# Patient Record
Sex: Female | Born: 1985 | Race: White | Hispanic: Yes | Marital: Single | Smoking: Never smoker
Health system: Southern US, Community
[De-identification: ages and names within clinical notes are randomized; demographics above are authoritative.]

## PROBLEM LIST (undated history)

## (undated) DIAGNOSIS — Z8759 Personal history of other complications of pregnancy, childbirth and the puerperium: Secondary | ICD-10-CM

## (undated) HISTORY — DX: Personal history of other complications of pregnancy, childbirth and the puerperium: Z87.59

## (undated) HISTORY — PX: NO PAST SURGERIES: SHX2092

---

## 2004-07-19 ENCOUNTER — Ambulatory Visit: Admission: RE | Admit: 2004-07-19 | Discharge: 2004-07-19 | Payer: Self-pay | Admitting: *Deleted

## 2004-09-17 ENCOUNTER — Inpatient Hospital Stay (HOSPITAL_COMMUNITY): Admission: AD | Admit: 2004-09-17 | Discharge: 2004-09-23 | Payer: Self-pay | Admitting: *Deleted

## 2004-09-17 ENCOUNTER — Ambulatory Visit: Payer: Self-pay | Admitting: Obstetrics & Gynecology

## 2004-09-21 ENCOUNTER — Encounter (INDEPENDENT_AMBULATORY_CARE_PROVIDER_SITE_OTHER): Payer: Self-pay | Admitting: *Deleted

## 2004-09-26 ENCOUNTER — Inpatient Hospital Stay (HOSPITAL_COMMUNITY): Admission: AD | Admit: 2004-09-26 | Discharge: 2004-09-26 | Payer: Self-pay | Admitting: Obstetrics and Gynecology

## 2004-10-15 ENCOUNTER — Encounter: Admission: RE | Admit: 2004-10-15 | Discharge: 2004-11-14 | Payer: Self-pay | Admitting: *Deleted

## 2005-04-06 ENCOUNTER — Emergency Department (HOSPITAL_COMMUNITY): Admission: EM | Admit: 2005-04-06 | Discharge: 2005-04-06 | Payer: Self-pay | Admitting: Emergency Medicine

## 2005-04-16 ENCOUNTER — Ambulatory Visit: Payer: Self-pay | Admitting: Internal Medicine

## 2005-04-17 ENCOUNTER — Ambulatory Visit: Payer: Self-pay | Admitting: Internal Medicine

## 2005-05-23 ENCOUNTER — Ambulatory Visit: Payer: Self-pay | Admitting: Family Medicine

## 2005-07-09 ENCOUNTER — Ambulatory Visit: Payer: Self-pay | Admitting: Internal Medicine

## 2005-12-17 IMAGING — US US OB FOLLOW-UP
1 series · 13 of 28 positions shown · non-contrast
Comparison: none

CLINICAL DATA: Assess growth.  Question pregnancy induced hypertension.

[Series 1: us ob follow-up · 0.32mm/px · 13 of 41 slices shown]
[im 2/41]
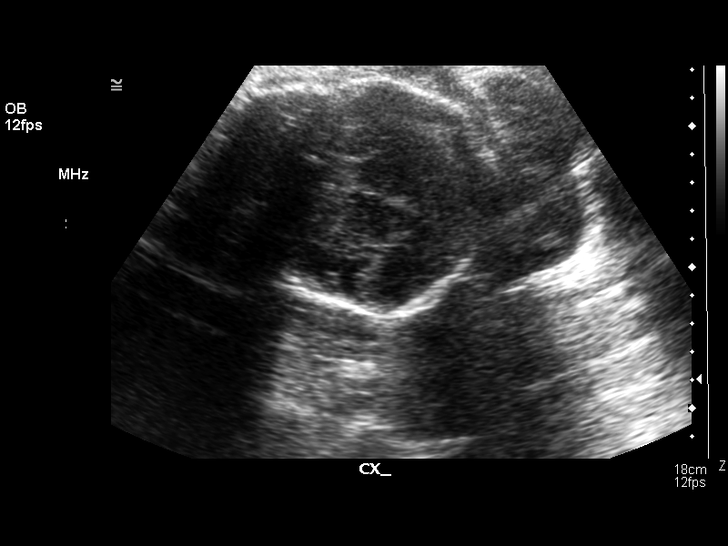
[im 5/41]
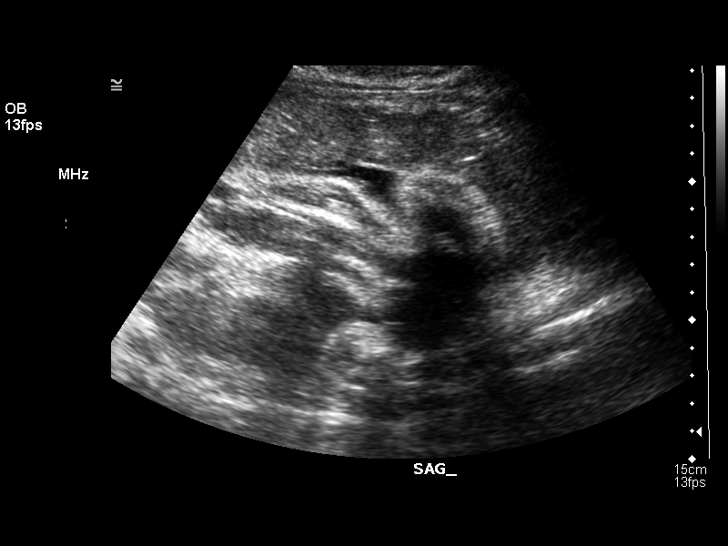
[im 8/41]
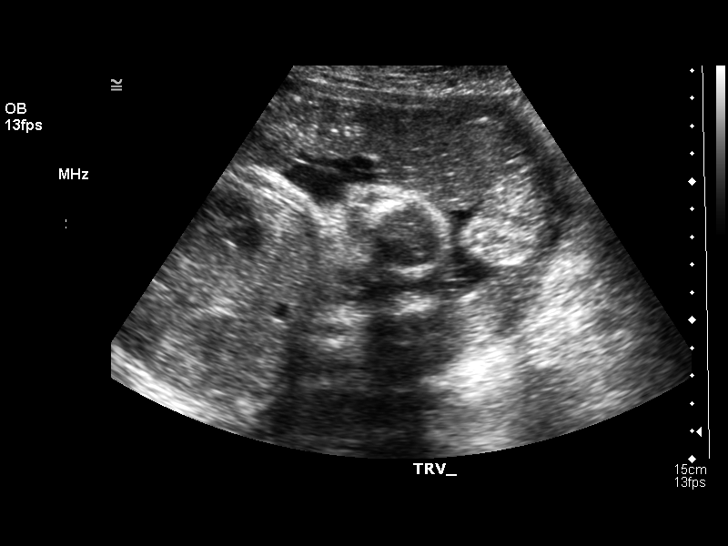
[im 11/41]
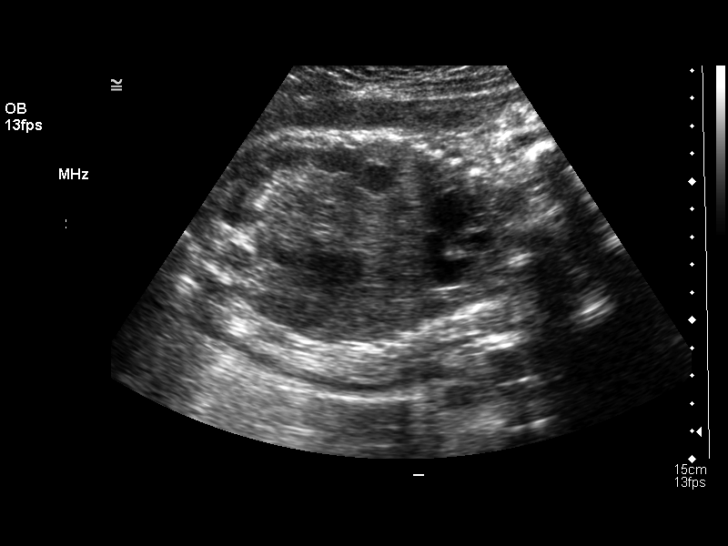
[im 14/41]
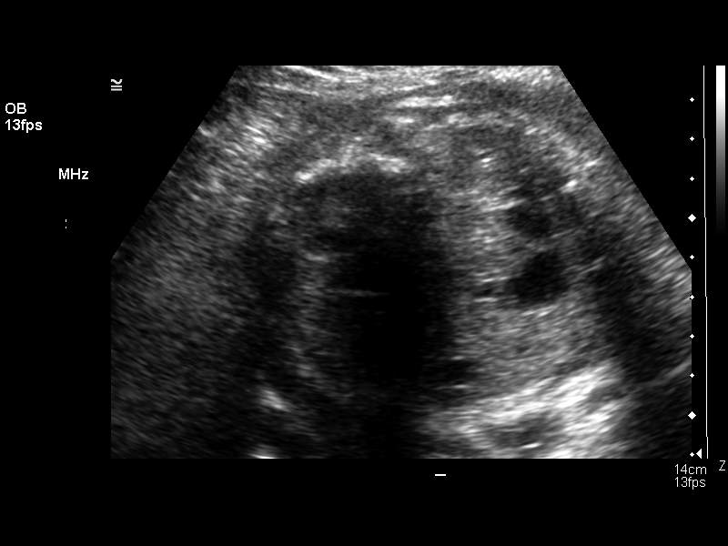
[im 17/41]
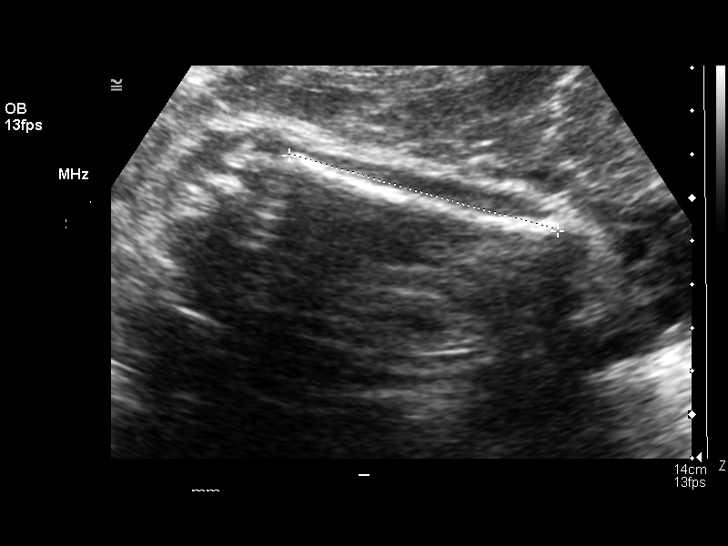
[im 21/41]
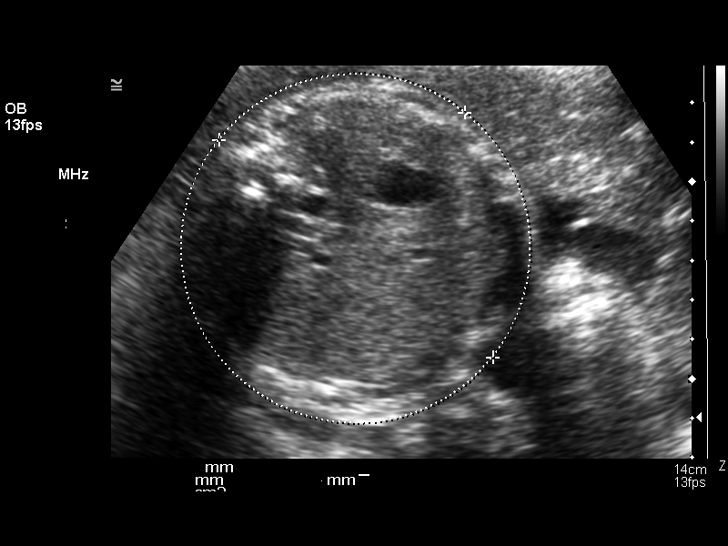
[im 24/41]
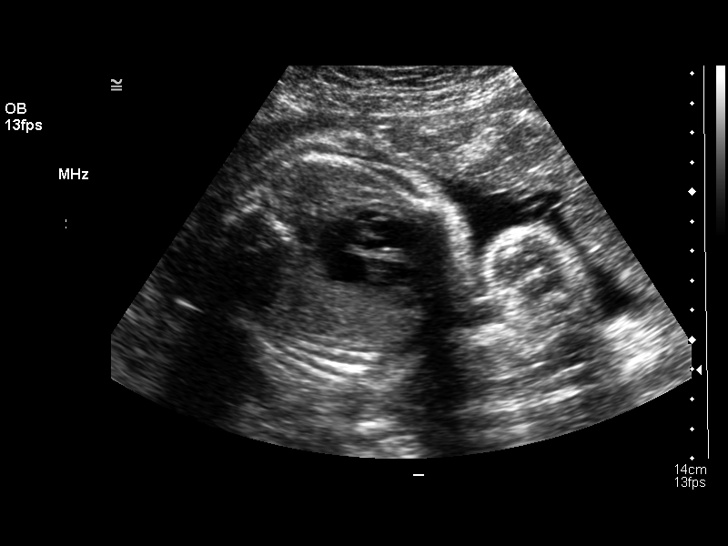
[im 27/41]
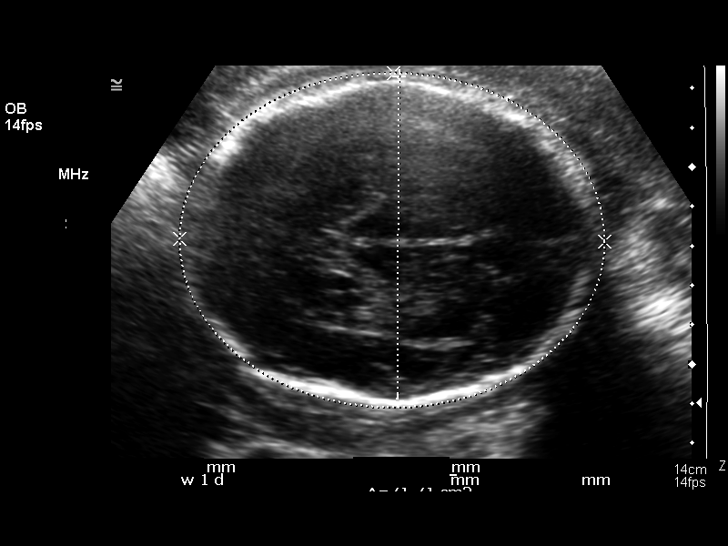
[im 30/41]
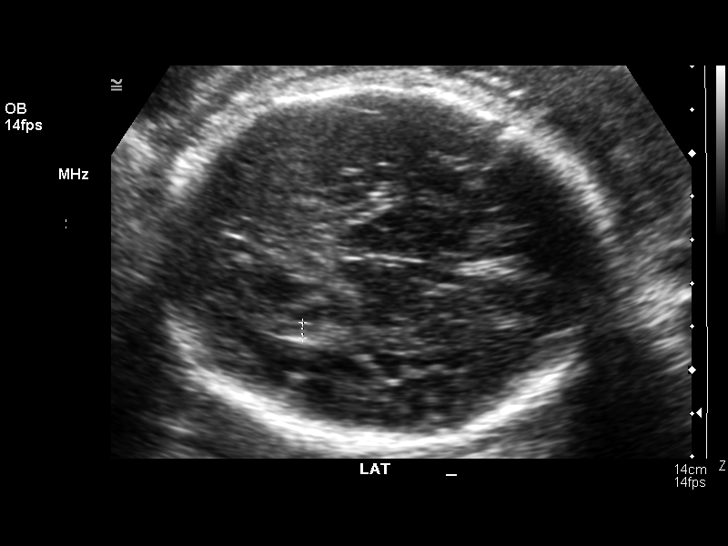
[im 33/41]
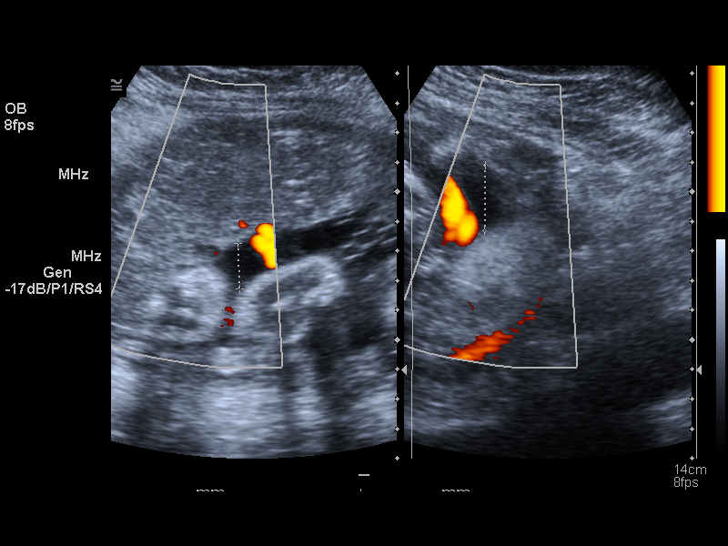
[im 36/41]
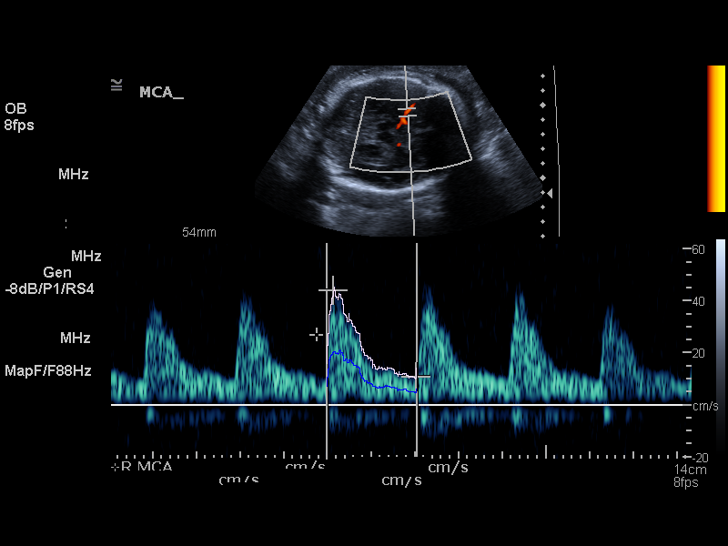
[im 39/41]
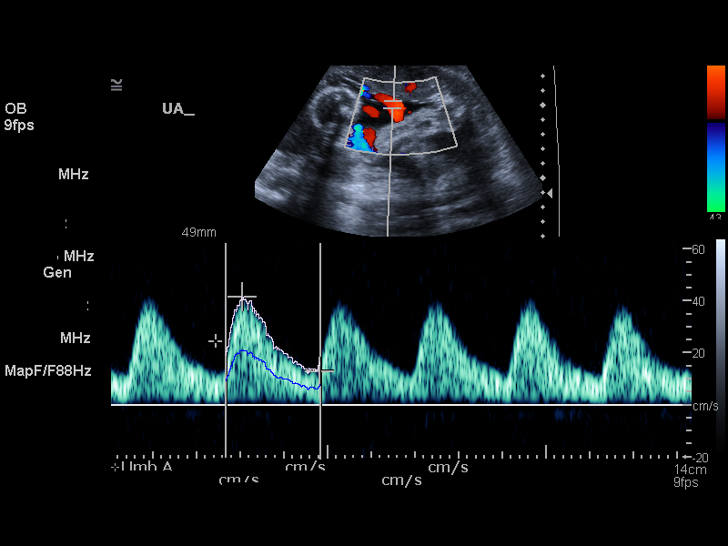

[13 of 28 positions shown; findings below may reference images not displayed]

OBSTETRICAL ULTRASOUND RE-EVALUATION:
Number of Fetuses:  1
Heart Rate:  147
Movement:  Yes
Breathing:  Yes
Presentation:  Cephalic
Placental Location:  Anterior
Grade:  II
Previa:  No
Amniotic Fluid (subjective):  Normal
Amniotic Fluid (objective):  10.1 cm AFI (5th -95th%ile = 8.1 ? 24.8 cm for 34 wks)

FETAL BIOMETRY
BPD:  8.2 cm   33 w 1 d
HC:  30.1 cm  33 w 3 d
AC:  28.3 cm   32 w 2 d
FL:  2.1 cm   33 w 2 d

Mean GA:  33 w 0 d
Assigned GA:  33 w 6 d
Fetal indices are within normal limits.  
EFW:  8938 g (H) 25th ? 50th%ile (5467 ? 1627 g) For 34 wks

FETAL ANATOMY
Lateral Ventricles:  Visualized 
Thalami/CSP:  Visualized 
Posterior Fossa:  Previously seen 
Nuchal Region:  N/A
Spine:  Previously seen 
4 Chamber Heart on Left:  Previously seen 
Stomach on Left:  Visualized 
3 Vessel Cord:  Previously seen 
Cord Insertion Site:  Previously seen 
Kidneys:  Visualized  
Bladder:  Visualized 
Extremities:  Previously seen 

Evaluation limited by:  Advanced gestational age 

MATERNAL FINDINGS
Cervix:  3.5 cm Transabdominally
BIOPHYSICAL PROFILE

Movement:  2    Time:  20 minutes
Breathing:  2
Tone:  2
Amniotic Fluid:  2

Total Score:  8

DOPPLER ULTRASOUND OF FETUS:

Umbilical Artery S/D Ratio: 3.01    (NL< 3.57)

Middle Cerebral Artery PI: 1.75    (NL> 1.35)
IMPRESSION: Single intrauterine pregnancy demonstrating an estimated gestational age by ultrasound of 33 weeks and 0 days.   This is 6 days behind assigned gestational age by initial ultrasound of 33 weeks and 6 days.  The abdominal circumference is smaller than the remaining gestational indicators with an absolute measurement of 28.3 cm (32 weeks 2 days).  Today the estimated fetal weight falls between the 25th and 50th percentile for a 34 week gestation and this represents a significant interval change since the previous exam performed at 25 weeks at which the estimated fetal weight was between the 75th and 90th percentile.  Findings suggest the possibility of developing asymmetric intrauterine growth restriction and close follow-up for growth is recommended.  
Subjectively and quantitatively normal amniotic fluid volume.  
Normal umbilical artery and middle cerebral artery Doppler assessment.
Biophysical profile score [DATE].  
No late developing fetal anatomic abnormalities are identified associated with the lateral ventricles, stomach, kidneys or bladder.  A four chamber heart view could not be reassessed due to positioning on today?s exam.

## 2006-10-25 ENCOUNTER — Emergency Department (HOSPITAL_COMMUNITY): Admission: EM | Admit: 2006-10-25 | Discharge: 2006-10-25 | Payer: Self-pay | Admitting: Emergency Medicine

## 2007-01-14 ENCOUNTER — Ambulatory Visit (HOSPITAL_COMMUNITY): Admission: RE | Admit: 2007-01-14 | Discharge: 2007-01-14 | Payer: Self-pay | Admitting: Family Medicine

## 2007-01-29 ENCOUNTER — Ambulatory Visit: Payer: Self-pay | Admitting: Obstetrics & Gynecology

## 2007-01-30 ENCOUNTER — Ambulatory Visit: Payer: Self-pay | Admitting: Gynecology

## 2007-02-19 ENCOUNTER — Ambulatory Visit: Payer: Self-pay | Admitting: Family Medicine

## 2007-03-05 ENCOUNTER — Ambulatory Visit: Payer: Self-pay | Admitting: Obstetrics & Gynecology

## 2007-03-19 ENCOUNTER — Ambulatory Visit: Payer: Self-pay | Admitting: Family Medicine

## 2007-04-02 ENCOUNTER — Ambulatory Visit: Payer: Self-pay | Admitting: Family Medicine

## 2007-04-03 ENCOUNTER — Ambulatory Visit (HOSPITAL_COMMUNITY): Admission: RE | Admit: 2007-04-03 | Discharge: 2007-04-03 | Payer: Self-pay | Admitting: Family Medicine

## 2007-04-16 ENCOUNTER — Ambulatory Visit: Payer: Self-pay | Admitting: Obstetrics & Gynecology

## 2007-04-23 ENCOUNTER — Ambulatory Visit: Payer: Self-pay | Admitting: Family Medicine

## 2007-04-30 ENCOUNTER — Inpatient Hospital Stay (HOSPITAL_COMMUNITY): Admission: AD | Admit: 2007-04-30 | Discharge: 2007-05-03 | Payer: Self-pay | Admitting: Obstetrics and Gynecology

## 2007-04-30 ENCOUNTER — Ambulatory Visit: Payer: Self-pay | Admitting: Obstetrics & Gynecology

## 2007-05-01 ENCOUNTER — Ambulatory Visit: Payer: Self-pay | Admitting: *Deleted

## 2007-05-04 ENCOUNTER — Ambulatory Visit: Admission: RE | Admit: 2007-05-04 | Discharge: 2007-05-04 | Payer: Self-pay | Admitting: Obstetrics and Gynecology

## 2008-07-29 IMAGING — US US FETAL BPP W/O NONSTRESS
1 series · 14 of 28 positions shown · non-contrast
Comparison: none

OBSTETRICAL ULTRASOUND:

 This ultrasound exam was performed in the [HOSPITAL] Ultrasound Department.  The OB US report was generated in the AS system, and faxed to the ordering physician.  This report is also available in [REDACTED] PACS.

[Series 1: us fetal bpp w/o nonstress · 0.33mm/px · 41 acquisitions, 14 frames shown]
[im 2/41]
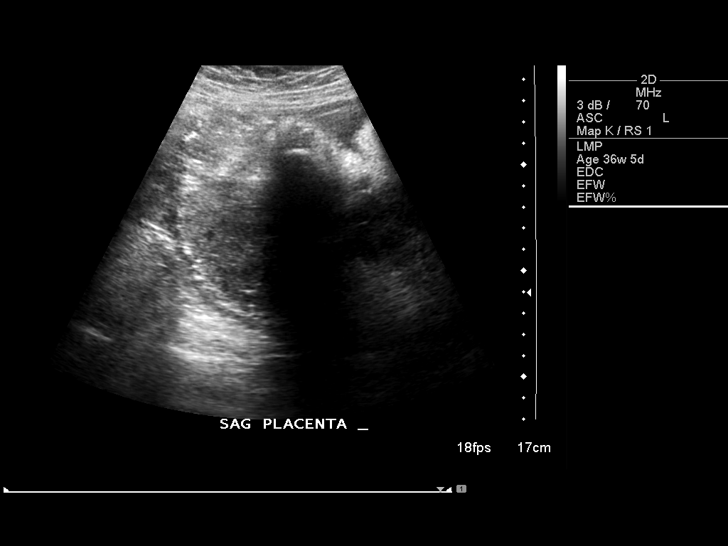
[im 5/41]
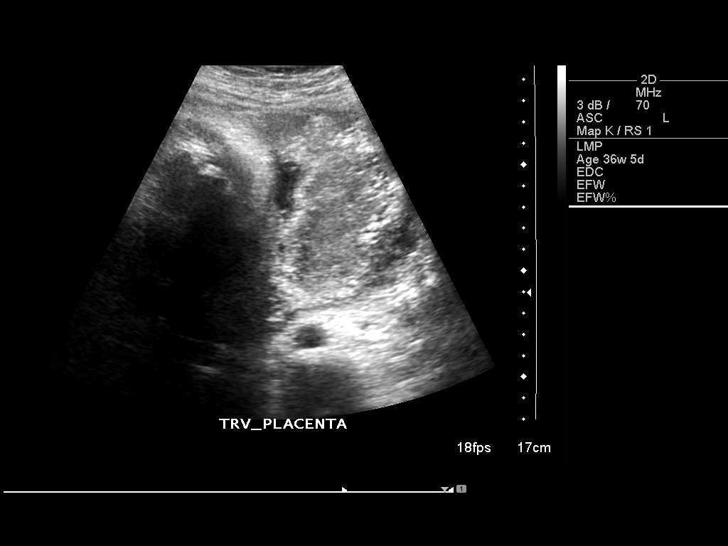
[im 8/41]
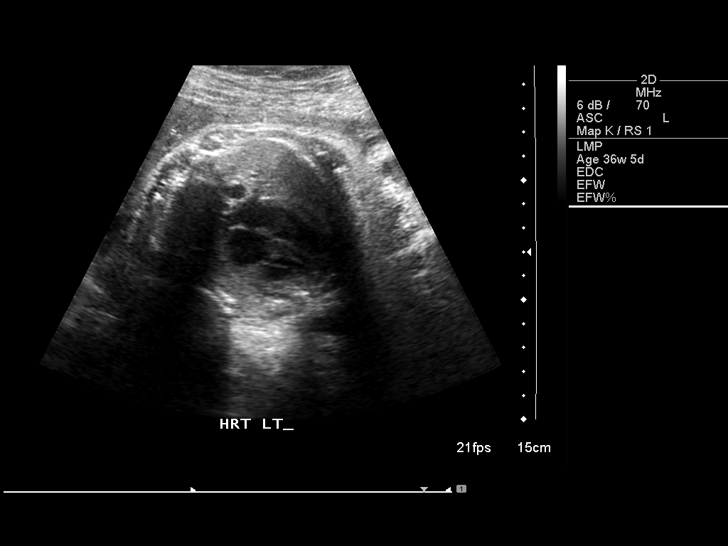
[im 11/41]
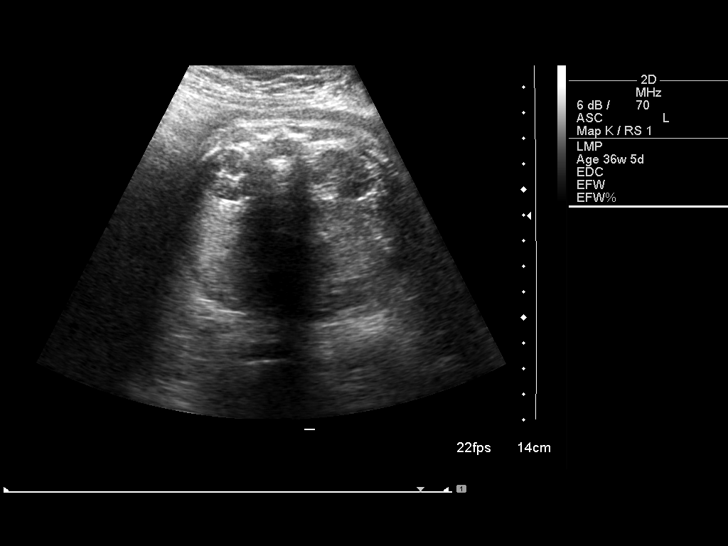
[im 14/41]
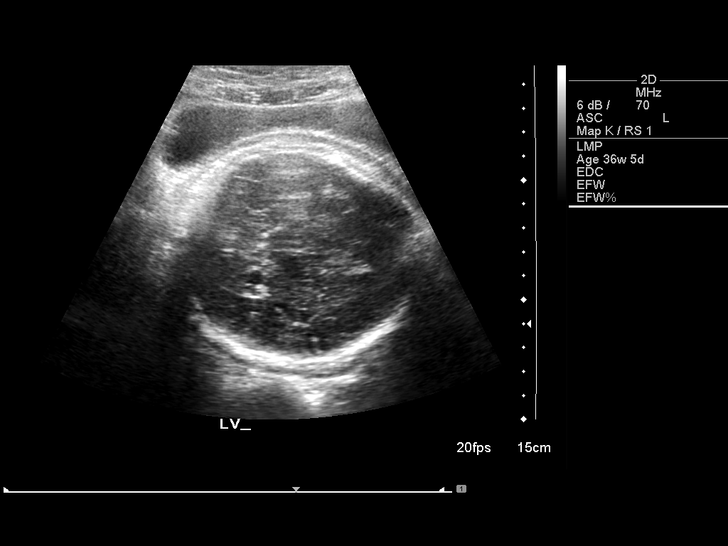
[im 17/41]
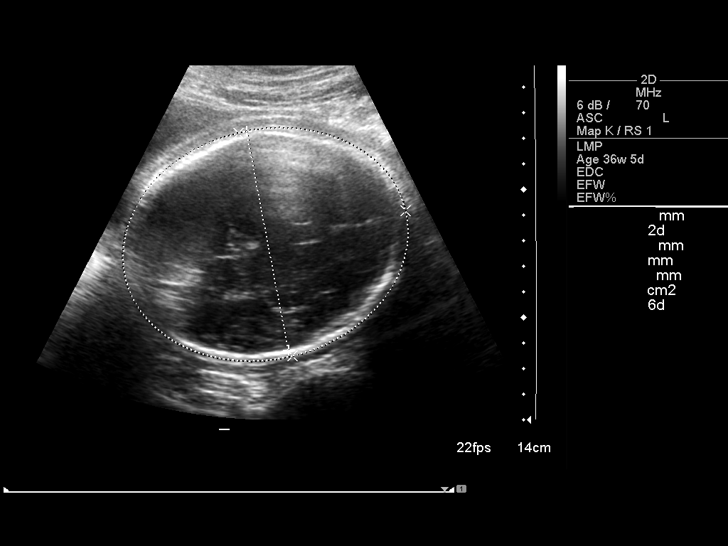
[im 20/41]
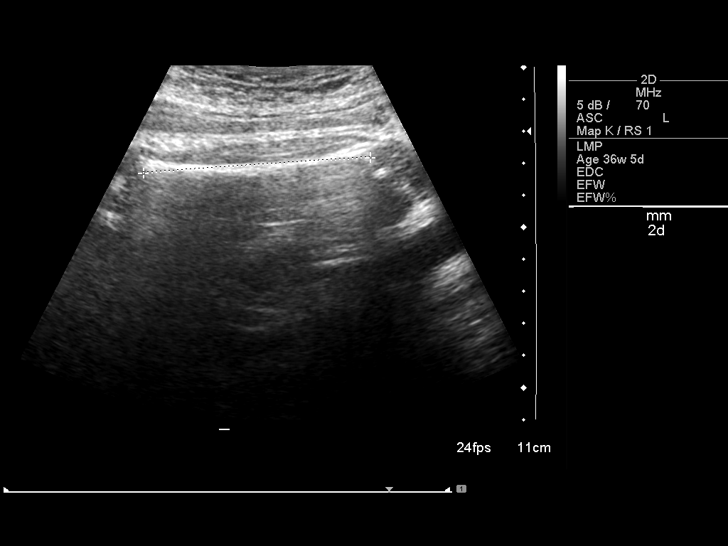
[im 23/41]
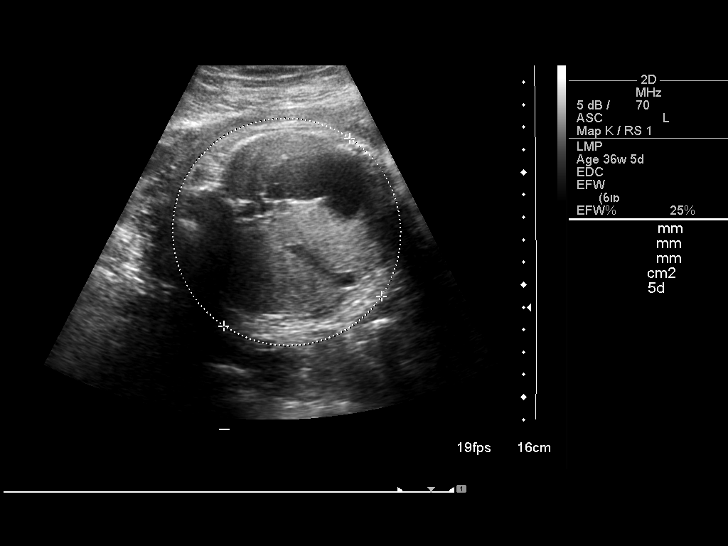
[im 26/41]
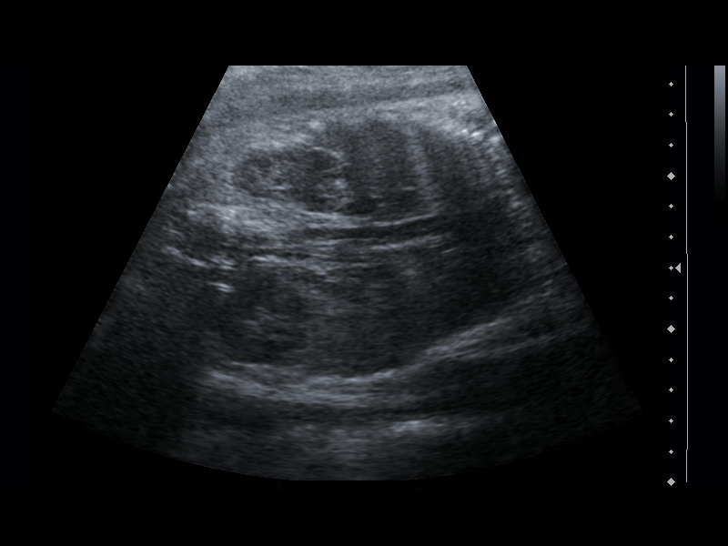
[im 29/41]
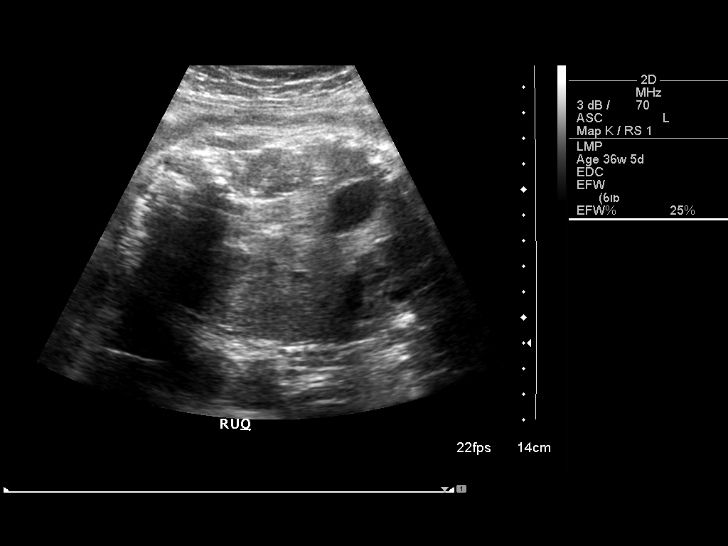
[im 32/41]
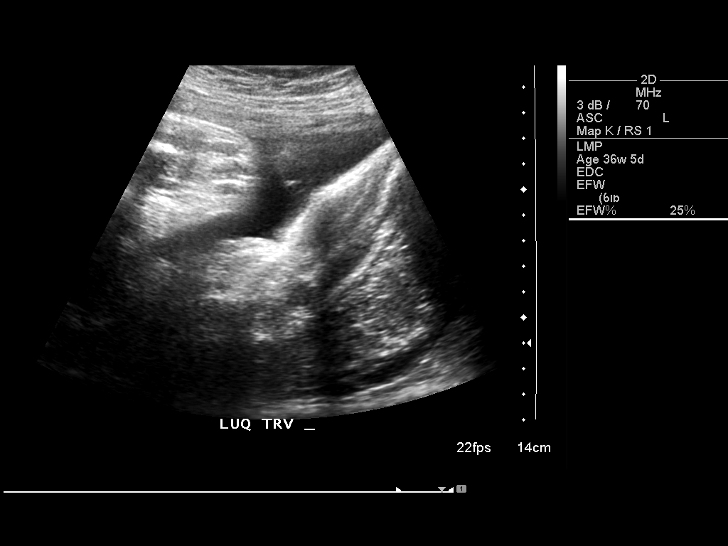
[im 35/41]
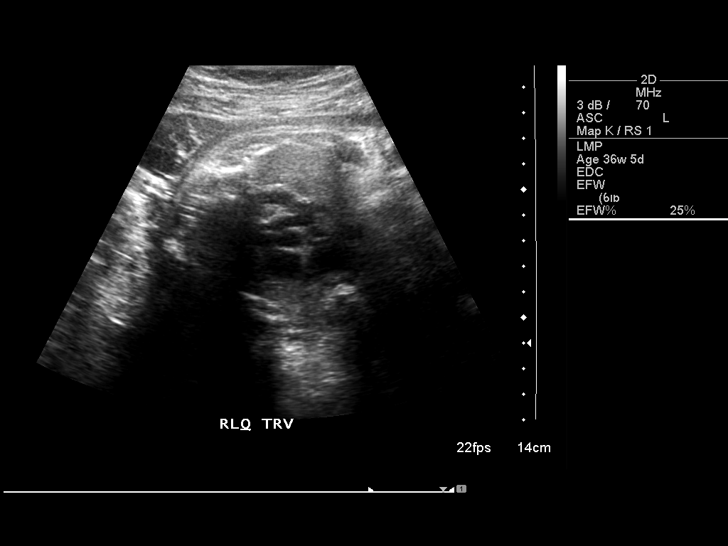
[im 38/41]
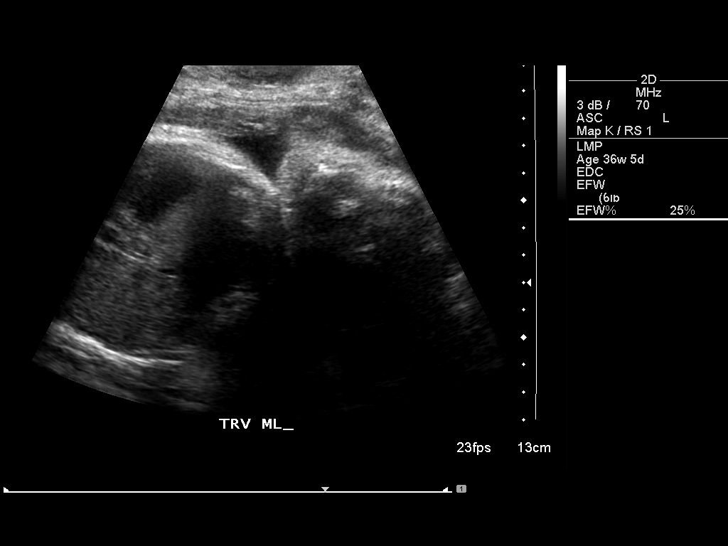
[im 41/41]
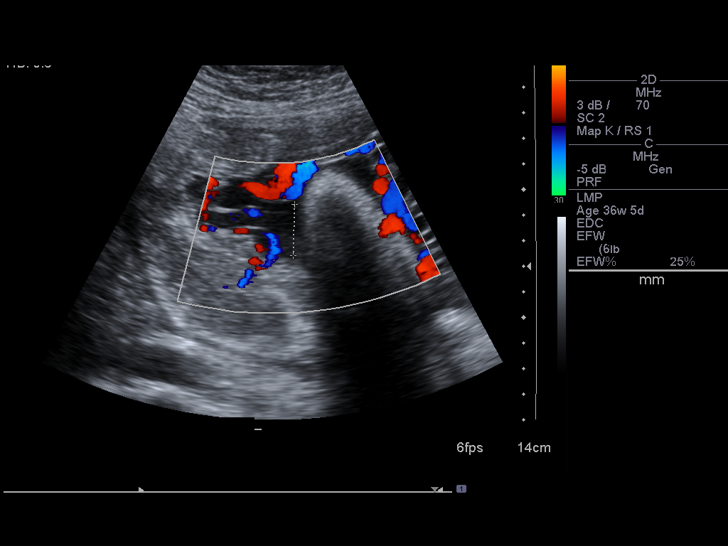

[14 of 28 positions shown; findings below may reference images not displayed]

IMPRESSION: See AS Obstetric US report.

## 2008-07-29 IMAGING — US US OB FOLLOW-UP
1 series · 3 of 3 positions shown · non-contrast
Comparison: none

OBSTETRICAL ULTRASOUND:

 This ultrasound exam was performed in the [HOSPITAL] Ultrasound Department.  The OB US report was generated in the AS system, and faxed to the ordering physician.  This report is also available in [REDACTED] PACS.

[Series 1: us ob follow-up · 3 of 3 slices shown]
[im 1/3]
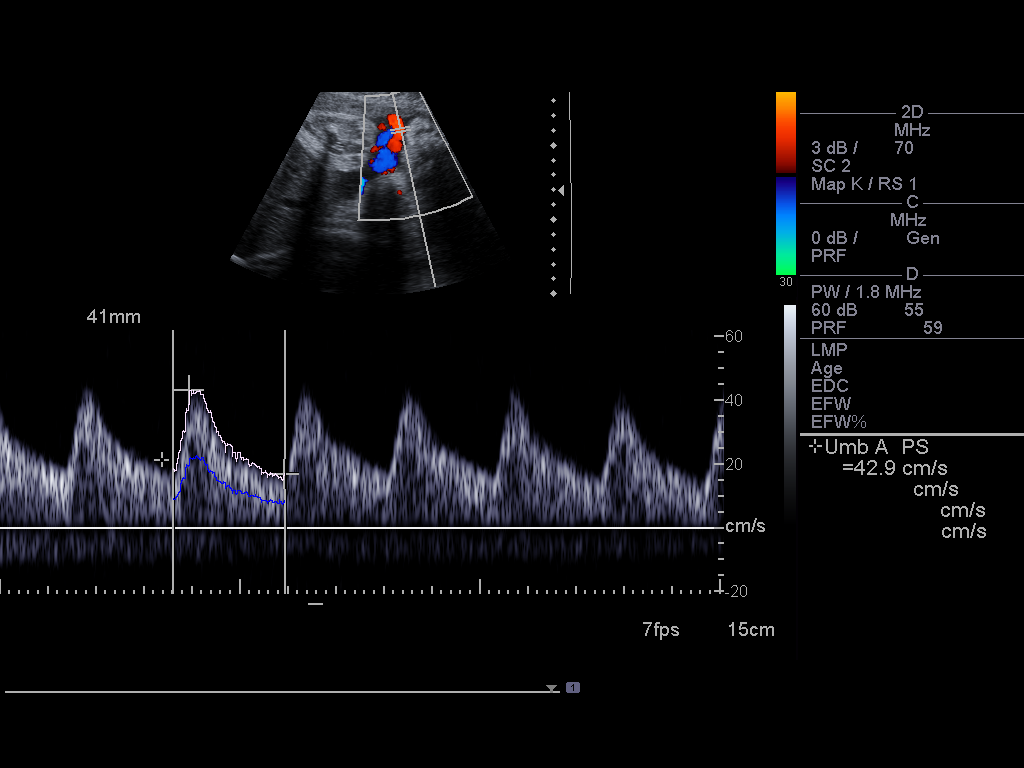
[im 2/3]
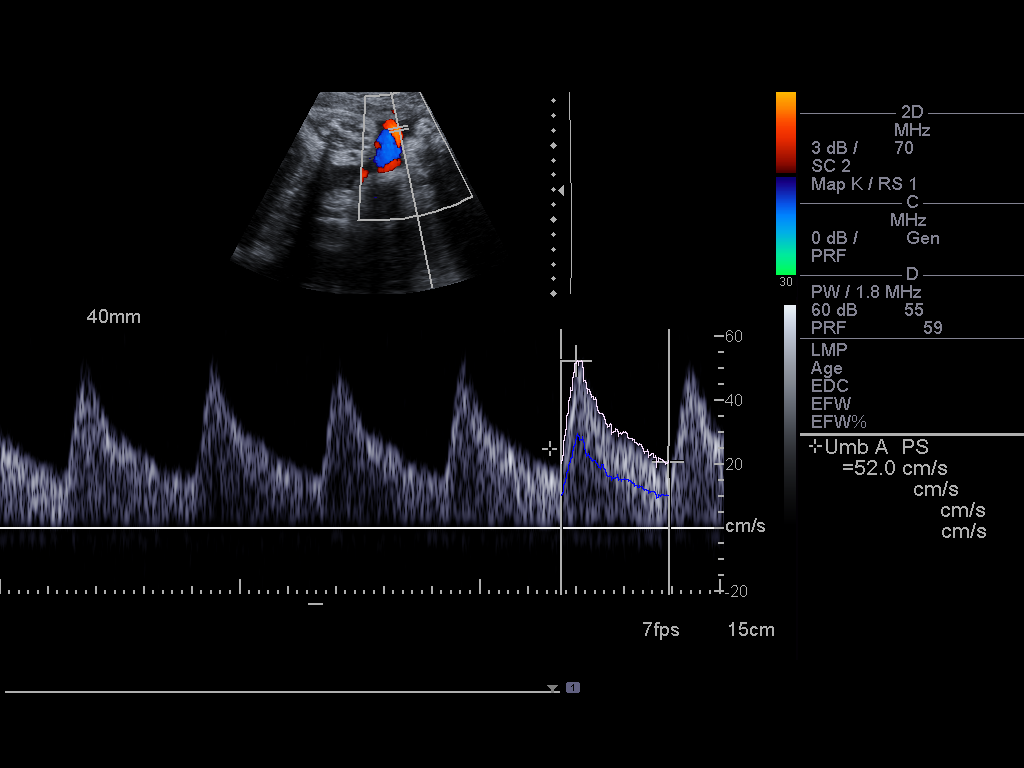
[im 3/3]
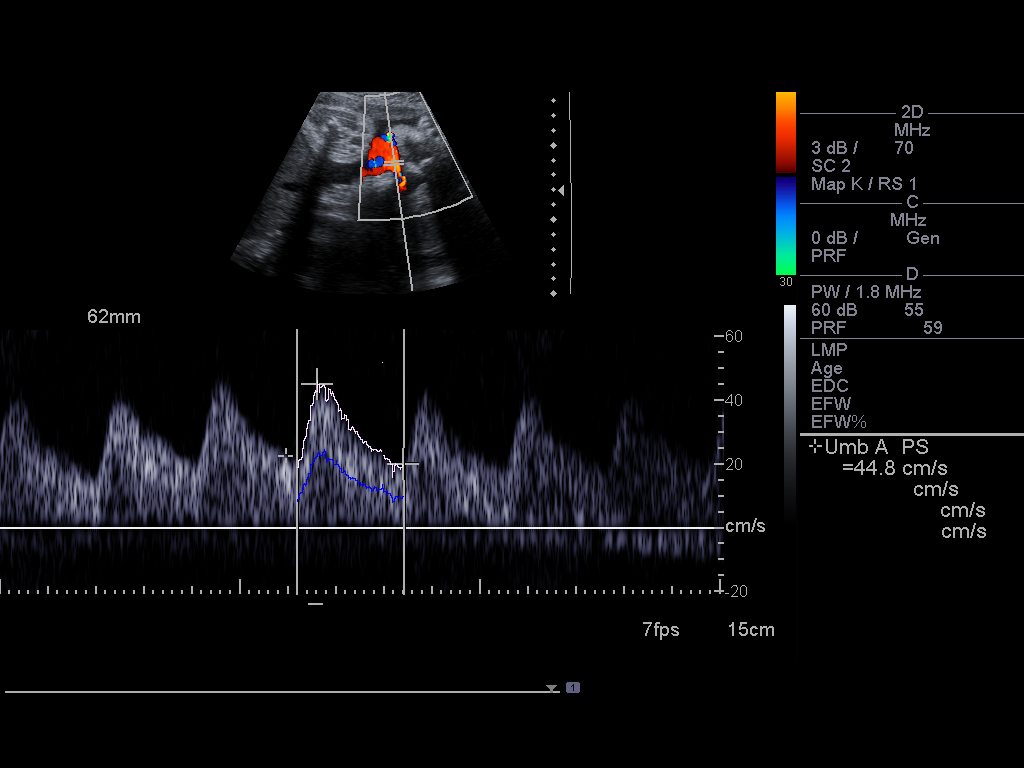

[3 of 3 positions shown; findings below may reference images not displayed]

IMPRESSION: See AS Obstetric US report.

## 2010-11-18 NOTE — L&D Delivery Note (Signed)
Delivery Note At 10:52 PM a viable female was delivered via Vaginal, Spontaneous Delivery (Presentation: Right Occiput Anterior).  APGAR: 8, 9; weight 2445g .   Placenta status: Intact, Spontaneous.  Cord: 3 vessels with the following complications: None.    Anesthesia: None  Episiotomy: None Lacerations: 1st degree Suture Repair: 3.0 vicryl Est. Blood Loss (mL): <  Mom to postpartum.  Baby to nursery-stable.  [name] 08/03/2011, 11:10 PM

## 2011-03-12 DIAGNOSIS — O0990 Supervision of high risk pregnancy, unspecified, unspecified trimester: Secondary | ICD-10-CM

## 2011-03-12 DIAGNOSIS — O0930 Supervision of pregnancy with insufficient antenatal care, unspecified trimester: Secondary | ICD-10-CM

## 2011-03-13 ENCOUNTER — Other Ambulatory Visit: Payer: Self-pay | Admitting: Family Medicine

## 2011-03-13 ENCOUNTER — Other Ambulatory Visit: Payer: Self-pay | Admitting: Obstetrics & Gynecology

## 2011-03-13 DIAGNOSIS — O0930 Supervision of pregnancy with insufficient antenatal care, unspecified trimester: Secondary | ICD-10-CM

## 2011-03-13 DIAGNOSIS — Z3689 Encounter for other specified antenatal screening: Secondary | ICD-10-CM

## 2011-03-13 DIAGNOSIS — Z331 Pregnant state, incidental: Secondary | ICD-10-CM

## 2011-03-13 LAB — RPR
RPR: NONREACTIVE
RPR: NONREACTIVE

## 2011-03-13 LAB — POCT URINALYSIS DIP (DEVICE)
Bilirubin Urine: NEGATIVE
Glucose, UA: NEGATIVE mg/dL
Hgb urine dipstick: NEGATIVE
Ketones, ur: NEGATIVE mg/dL
Nitrite: NEGATIVE
Protein, ur: NEGATIVE mg/dL
Specific Gravity, Urine: 1.02 (ref 1.005–1.030)
Urobilinogen, UA: 0.2 mg/dL (ref 0.0–1.0)
pH: 8.5 — ABNORMAL HIGH (ref 5.0–8.0)

## 2011-03-13 LAB — CBC
HCT: 35 % — AB (ref 36–46)
HCT: 35 % — AB (ref 36–46)
Hemoglobin: 12.2 g/dL (ref 12.0–16.0)
Hemoglobin: 12.2 g/dL (ref 12.0–16.0)
Platelets: 265 10*3/uL (ref 150–399)
Platelets: 265 10*3/uL (ref 150–399)

## 2011-03-13 LAB — GC/CHLAMYDIA PROBE AMP, GENITAL
Chlamydia: NEGATIVE
Chlamydia: NEGATIVE
Gonorrhea: NEGATIVE
Gonorrhea: NEGATIVE

## 2011-03-13 LAB — ABO/RH: RH Type: POSITIVE

## 2011-03-13 LAB — ANTIBODY SCREEN: Antibody Screen: NEGATIVE

## 2011-03-13 LAB — HEPATITIS B SURFACE ANTIGEN: Hepatitis B Surface Ag: NEGATIVE

## 2011-03-13 LAB — HIV ANTIBODY (ROUTINE TESTING W REFLEX): HIV: NONREACTIVE

## 2011-03-13 LAB — RUBELLA ANTIBODY, IGM: Rubella: IMMUNE

## 2011-03-15 ENCOUNTER — Ambulatory Visit (HOSPITAL_COMMUNITY)
Admission: RE | Admit: 2011-03-15 | Discharge: 2011-03-15 | Disposition: A | Discharge status: Home / Self Care | Payer: Self-pay | Source: Ambulatory Visit | Attending: Obstetrics & Gynecology | Admitting: Obstetrics & Gynecology

## 2011-03-15 DIAGNOSIS — O0930 Supervision of pregnancy with insufficient antenatal care, unspecified trimester: Secondary | ICD-10-CM

## 2011-03-15 DIAGNOSIS — O09299 Supervision of pregnancy with other poor reproductive or obstetric history, unspecified trimester: Secondary | ICD-10-CM | POA: Insufficient documentation

## 2011-03-15 DIAGNOSIS — Z1389 Encounter for screening for other disorder: Secondary | ICD-10-CM | POA: Insufficient documentation

## 2011-03-15 DIAGNOSIS — Z3689 Encounter for other specified antenatal screening: Secondary | ICD-10-CM

## 2011-03-15 DIAGNOSIS — Z363 Encounter for antenatal screening for malformations: Secondary | ICD-10-CM | POA: Insufficient documentation

## 2011-03-15 DIAGNOSIS — O358XX0 Maternal care for other (suspected) fetal abnormality and damage, not applicable or unspecified: Secondary | ICD-10-CM | POA: Insufficient documentation

## 2011-03-15 DIAGNOSIS — Z8751 Personal history of pre-term labor: Secondary | ICD-10-CM | POA: Insufficient documentation

## 2011-04-05 NOTE — Discharge Summary (Signed)
[name], [name]             ACCOUNT NO.:  [id number]   MEDICAL RECORD NO.:  [id number]          PATIENT TYPE:  INP   LOCATION:  9319                          FACILITY:  [facility]   PHYSICIAN:  Conni Elliot, M.D.DATE OF BIRTH:  April 27, 1986   DATE OF ADMISSION:  09/17/2004  DATE OF DISCHARGE:  09/23/2004                                 DISCHARGE SUMMARY   ADMISSION DIAGNOSES:  1.  A 25 year old gravida 1 at 34 weeks 1 day.  2.  Group B streptococcus positive.   DISCHARGE DIAGNOSES:  1.  A 25 year old gravida 1 para 1.  2.  Viable female delivered by spontaneous vaginal delivery at 34 weeks 1      day.   DISCHARGE MEDICATIONS:  Ibuprofen 600 mg p.o. q.6h. p.r.n. pain.   ADMISSION HISTORY:  Ms. [name] is a 25 year old G1 who presents to the  MAU at 34 weeks 1 day, sent over from [facility] for high blood  pressure.  On admission, she denied contractions, denied any vaginal  bleeding, membranes were intact, good fetal activity.  Admission blood  pressure was 136/102, fetal heart tones 135-140, positive reactivity, good  variability, mild variable decelerations.  The patient was admitted to L&D.  Cervical was placed for induction of labor.  The patient's blood pressure  remained in the 140s over 90s to a maximum of 103.  On September 20, 2004 the  patient reported to the nurse that she was having some blurred vision.  The  patient denied headache, shortness of breath, chest pain, or vision changes  to medical staff when the patient was visited for evaluation.  On physical  exam, the patient was alert and oriented, mild discomfort due to contraction  pain.  Heart was regular rate and rhythm.  Lungs were clear to auscultation  bilaterally.  Abdomen was nontender after contractions.  No edema was noted.  Reflexes were 1+.  The patient was noted to have an elevated blood pressure  to 172/121.  The patient then complained of a bilateral temporal headache  and started seizing.   Seizure activity started peripherally and went to  generalized tonic-clonic.  The seizure lasted approximately 90 seconds and  stopped spontaneously.  The patient postictally was mildly combative.  The  patient was given magnesium 4 g load and then 2 g IV per hour.  Labetalol  was infused 20 mg IV and blood pressure came down to 140/100.  Fetal heart  tones remained in the 150s with good variability.  The patient bit her  tongue and had some mild bleeding in oropharynx.  The patient was sent for  head CT.  There was no evidence of intracranial hemorrhage, brain edema, or  mass effect found.  Ventricles are normal.  No extraaxial abnormalities were  identified.  Overall impression:  The CT was negative, noncontrast head CT.  PIH labs after seizure:  LFTs within normal limits, platelets 262, uric acid  6.8, creatinine 0.8.  Eclampsia remained stable throughout the rest of  hospital admission.  The patient's labor continued to progress.  The patient  became complete and pushed for about  15 minutes.  A viable female infant was  born by spontaneous vaginal delivery over intact perineum with Apgars 7 at  one minute and 9 at five minutes.  Placenta was delivered spontaneously and  intact with three-vessel cord.  A moderate amount of bleeding noted.  Uterus  was explored and small pieces of placenta were removed.  The uterus became  firm after Pitocin and Cytotec per rectum.  The patient was sent to AICU to  continue magnesium sulfate for 24 hours.  Blood pressure remained well  controlled in AICU with good urinary output.  Magnesium sulfate was  discontinued after 24 hours.   CONDITION ON DISCHARGE:  Stable.  Hemoglobin 9.6 on September 22, 2004.   INSTRUCTIONS GIVEN TO PATIENT:  The patient [facility] for postpartum appointment.  The patient will return to  the MAU 3 days after discharge to have blood pressure checked.      VRE/MEDQ  D:  10/17/2004  T:   10/17/2004  Job:  [id number]

## 2011-04-10 ENCOUNTER — Other Ambulatory Visit: Payer: Self-pay | Admitting: Obstetrics and Gynecology

## 2011-04-10 DIAGNOSIS — O4700 False labor before 37 completed weeks of gestation, unspecified trimester: Secondary | ICD-10-CM

## 2011-04-10 DIAGNOSIS — Z331 Pregnant state, incidental: Secondary | ICD-10-CM

## 2011-04-10 LAB — POCT URINALYSIS DIP (DEVICE)
Bilirubin Urine: NEGATIVE
Glucose, UA: NEGATIVE mg/dL
Hgb urine dipstick: NEGATIVE
Ketones, ur: NEGATIVE mg/dL
Nitrite: NEGATIVE
Protein, ur: NEGATIVE mg/dL
Specific Gravity, Urine: 1.02 (ref 1.005–1.030)
Urobilinogen, UA: 0.2 mg/dL (ref 0.0–1.0)
pH: 8 (ref 5.0–8.0)

## 2011-05-08 ENCOUNTER — Other Ambulatory Visit: Payer: Self-pay | Admitting: Obstetrics and Gynecology

## 2011-05-08 DIAGNOSIS — O0930 Supervision of pregnancy with insufficient antenatal care, unspecified trimester: Secondary | ICD-10-CM

## 2011-05-08 LAB — POCT URINALYSIS DIP (DEVICE)
Bilirubin Urine: NEGATIVE
Glucose, UA: NEGATIVE mg/dL
Hgb urine dipstick: NEGATIVE
Ketones, ur: NEGATIVE mg/dL
Leukocytes, UA: NEGATIVE
Nitrite: NEGATIVE
Protein, ur: 30 mg/dL — AB
Specific Gravity, Urine: 1.02 (ref 1.005–1.030)
Urobilinogen, UA: 0.2 mg/dL (ref 0.0–1.0)
pH: 8.5 — ABNORMAL HIGH (ref 5.0–8.0)

## 2011-05-23 ENCOUNTER — Other Ambulatory Visit: Payer: Self-pay | Admitting: Obstetrics & Gynecology

## 2011-05-23 DIAGNOSIS — O0930 Supervision of pregnancy with insufficient antenatal care, unspecified trimester: Secondary | ICD-10-CM

## 2011-05-23 LAB — STREP B DNA PROBE: GBS: POSITIVE

## 2011-06-06 ENCOUNTER — Other Ambulatory Visit: Payer: Self-pay | Admitting: Family Medicine

## 2011-06-06 DIAGNOSIS — O0930 Supervision of pregnancy with insufficient antenatal care, unspecified trimester: Secondary | ICD-10-CM

## 2011-06-20 ENCOUNTER — Other Ambulatory Visit: Payer: Self-pay | Admitting: Family Medicine

## 2011-06-20 DIAGNOSIS — O0930 Supervision of pregnancy with insufficient antenatal care, unspecified trimester: Secondary | ICD-10-CM

## 2011-07-03 ENCOUNTER — Other Ambulatory Visit: Payer: Self-pay | Admitting: Obstetrics & Gynecology

## 2011-07-03 DIAGNOSIS — O0930 Supervision of pregnancy with insufficient antenatal care, unspecified trimester: Secondary | ICD-10-CM

## 2011-07-17 ENCOUNTER — Ambulatory Visit (INDEPENDENT_AMBULATORY_CARE_PROVIDER_SITE_OTHER): Payer: Self-pay | Admitting: Advanced Practice Midwife

## 2011-07-17 VITALS — BP 136/91 | Temp 98.4°F | Ht 60.5 in | Wt 184.3 lb

## 2011-07-17 DIAGNOSIS — Z3403 Encounter for supervision of normal first pregnancy, third trimester: Secondary | ICD-10-CM

## 2011-07-17 DIAGNOSIS — O09299 Supervision of pregnancy with other poor reproductive or obstetric history, unspecified trimester: Secondary | ICD-10-CM

## 2011-07-17 DIAGNOSIS — O169 Unspecified maternal hypertension, unspecified trimester: Secondary | ICD-10-CM

## 2011-07-17 DIAGNOSIS — Z3400 Encounter for supervision of normal first pregnancy, unspecified trimester: Secondary | ICD-10-CM

## 2011-07-17 LAB — POCT URINALYSIS DIP (DEVICE)
Glucose, UA: NEGATIVE mg/dL
Hgb urine dipstick: NEGATIVE
Leukocytes, UA: NEGATIVE
Nitrite: NEGATIVE
Protein, ur: 30 mg/dL — AB
Specific Gravity, Urine: 1.025 (ref 1.005–1.030)
Urobilinogen, UA: 0.2 mg/dL (ref 0.0–1.0)
pH: 6 (ref 5.0–8.0)

## 2011-07-17 NOTE — Progress Notes (Signed)
Doing well. Denies contractions or bleeding. Denies headache, visual changes, or swelling.   Discussed slight elevation in BP and S/S preeclampsia.  GC/Chl done. Cervix 1cm/long.  Labor precautions reviewed.

## 2011-07-17 NOTE — Progress Notes (Signed)
Addended by: Aviva Signs on: 07/17/2011 09:44 AM   Modules accepted: Orders

## 2011-07-17 NOTE — Progress Notes (Signed)
Addended by: Aviva Signs on: 07/17/2011 09:43 AM   Modules accepted: Orders

## 2011-07-17 NOTE — Patient Instructions (Signed)
Preeclampsia y eclampsia (Toxemia del embarazo) (Preeclampsia and Eclampsia, Toxemia of Pregnancy) La preeclampsia es un trastorno por el cual hay un aumento de la presin arterial durante el embarazo. Ocurre despus de la 20a. semana de gestacin. Si se produce durante la segunda mitad del Psychiatrist, y no hay otros sntomas, se denomina hipertensin gestacional y desaparece luego que el beb nace. Si junto a la hipertensin gestacional se desarrolla alguno de los sntomas que se enumeran ms abajo, el trastorno se denomina preeclampsia. La eclampsia (convulsiones) puede seguir a la preeclampsia. sta es una de las razones por las que deben realizarse controles prenatales. Es muy importante realizar un diagnstico y un tratamiento precoz para la prevencin. CAUSAS No existe una causa conocida para este problema. Existen algunos problemas que pueden poner a la mujer embarazada en riesgo de sufrirlo. Ellos son:  Engineer, agricultural.  Haber sufrido preeclampsia en embarazos anteriores.   Sufrir hipertensin crnica.   Si se trata de un embarazo mltiple (mellizos, trillizos).   Tener 35 aos o ms.  Pertenecer a la Public affairs consultant.   Sufrir problemas renales o ser diabtica.   Sufrir enfermedades como lupus o problemas sanguneos.   Tener sobrepeso (ser Maryjane Hurter).   SNTOMAS  Presin arterial elevada.  Dolor de cabeza   Aumento de peso sbito.   Hinchazn de manos, rostro, piernas y pies   Protenas en la orina.   Nuseas y vmitos   Dolor de estmago   Problemas visuales (visin doble o borrosa).   Adormecimiento del rostro, brazos, piernas y pies.  Mareos.   Habla arrastrando las palabras.   La preeclampsia puede causar un retraso en la maduracin del feto.   Separacin (abrupcin) de la placenta.   No hay suficiente lquido en el saco amnitico (oligohidramnios).   Sensibilidad a la luz brillante.   Dolor abdominal.   DIAGNSTICO Si se descubren protenas en la  orina durante la segunda mitad del [place], esto se considera preeclampsia. Tambin puede haber otros de los sntomas ya mencionados. TRATAMIENTO Es necesario Pensions consultant.   El profesional que la asiste podr prescribirle reposo en cama en las primeras etapas de la enfermedad. Mucho reposo y restriccin de sal puede ser todo lo que necesite.   Si no responde a los tratamientos ms conservadores, podr ser necesaria la administracin de medicamentos.   En los casos graves, podr ser necesaria la hospitalizacin.   Para tratar la hipertensin.   Para controlar la retencin de lquidos.   Para verificar que el beb no sufra ningn dao.   La hospitalizacin es el mejor modo de tratar los primeros signos de preeclampsia. [employer], la mam y el beb pueden ser observados cuidadosamente y los anlisis de sangre se realizarn de manera ms efectiva y precisa.   Si el trastorno se hace ms grave, podr ser necesario inducir el parto o practicar una cesrea (extraccin del beb por medios quirrgicos). El mejor tratamiento para la preeclampsia/eclampsia es el [place].  La preeclampsia y la eclampsia implican riesgos para la madre y el beb. El profesional lo comentar con usted.. Juntos pueden elaborar la mejor manera de abordar sus problemas.  INSTRUCCIONES PARA EL CUIDADO DOMICILIARIO  Cumpla con las citas y los anlisis prenatales tal como se le indic.   Consulte con el mdico si tiene alguno de [employer].   Descanse y duerma lo suficiente.   Consuma una dieta balanceada, baja en sal, y no agregue sal a la comida.   Evite las situaciones  estresantes.   Slo tome medicamentos de venta libre o de prescripcin para Chief Technology Officer, Environmental health practitioner o la fiebre, segn le haya indicado el mdico.  SOLICITE ATENCIN MDICA DE INMEDIATO SI:  Observa que se hincha alguna zona del cuerpo, generalmente las piernas.   Aumenta 1 o ms en una semana.   Presenta una cefalea intensa,  mareos, problemas visuales o confusin.   Tiene dolor en el abdomen, nuseas o vmitos.   Sufre convulsiones.   Tiene dificultad para mover cualquier parte del cuerpo, siente adormecimiento o tiene dificultad para hablar.   Observa un hematoma o una hemorragia anormal.   Aparece rigidez en el cuello.   Vomita.  Document Released: 08/14/2005 Document Re-Released: 01/29/2010 [facility] Patient Information 2011 [place], [place].

## 2011-07-17 NOTE — Progress Notes (Signed)
Pulse 64.  No vaginal discharge.

## 2011-07-18 ENCOUNTER — Other Ambulatory Visit: Payer: Self-pay | Admitting: Advanced Practice Midwife

## 2011-07-18 LAB — GC/CHLAMYDIA PROBE AMP, GENITAL
Chlamydia, DNA Probe: NEGATIVE
GC Probe Amp, Genital: NEGATIVE

## 2011-07-23 LAB — CREATININE CLEARANCE, URINE, 24 HOUR
Creatinine, 24H Ur: 1443 mg/d (ref 700–1800)
Creatinine, Urine: 131.2 mg/dL

## 2011-07-24 ENCOUNTER — Ambulatory Visit (INDEPENDENT_AMBULATORY_CARE_PROVIDER_SITE_OTHER): Payer: Self-pay | Admitting: Family Medicine

## 2011-07-24 DIAGNOSIS — IMO0002 Reserved for concepts with insufficient information to code with codable children: Secondary | ICD-10-CM

## 2011-07-24 DIAGNOSIS — O139 Gestational [pregnancy-induced] hypertension without significant proteinuria, unspecified trimester: Secondary | ICD-10-CM

## 2011-07-24 DIAGNOSIS — N898 Other specified noninflammatory disorders of vagina: Secondary | ICD-10-CM

## 2011-07-24 DIAGNOSIS — O26899 Other specified pregnancy related conditions, unspecified trimester: Secondary | ICD-10-CM

## 2011-07-24 LAB — WET PREP, GENITAL: Yeast Wet Prep HPF POC: NONE SEEN

## 2011-07-24 LAB — POCT URINALYSIS DIP (DEVICE)
Hgb urine dipstick: NEGATIVE
Leukocytes, UA: NEGATIVE
Nitrite: NEGATIVE
Protein, ur: NEGATIVE mg/dL
Urobilinogen, UA: 0.2 mg/dL (ref 0.0–1.0)
pH: 6.5 (ref 5.0–8.0)

## 2011-07-24 LAB — COMPREHENSIVE METABOLIC PANEL
ALT: 12 U/L (ref 0–35)
AST: 18 U/L (ref 0–37)
BUN: 10 mg/dL (ref 6–23)
CO2: 20 mEq/L (ref 19–32)
Calcium: 8.5 mg/dL (ref 8.4–10.5)
Chloride: 102 mEq/L (ref 96–112)
Creat: 0.51 mg/dL (ref 0.50–1.10)
Total Bilirubin: 0.3 mg/dL (ref 0.3–1.2)

## 2011-07-24 LAB — CBC
MCH: 32.5 pg (ref 26.0–34.0)
MCHC: 33.8 g/dL (ref 30.0–36.0)
Platelets: 237 10*3/uL (ref 150–400)
RDW: 14 % (ref 11.5–15.5)

## 2011-07-24 NOTE — Progress Notes (Signed)
Subjective:    [name] is a 25 y.o. female being seen today for her routine obstetrical visit. She is at [redacted]w[redacted]d gestation. Patient reports vaginal discharge. She states it is the same as it has been during her pregnancy, but is wanting it evaluated today. She denies that she is having vaginal bleeding or contractions.. Fetal movement: normal. She is being seen in [facility] for PIH. She returned her 24-hr urine earlier this week but there was a lab error in that her blood was not drawn at the appropriate time and she needs to repeat her urine collection and get her blood drawn.  Menstrual History: OB History    Grav Para Term Preterm Abortions TAB SAB Ect Mult Living   3 2 0 2      2       No LMP recorded. Patient is pregnant.    Objective:    BP 125/85  Pulse 68  Temp 98.1 F (36.7 C)  Wt 84.959 kg (187 lb 4.8 oz) FHT: 151 BPM  Uterine Size: 37.5 cm  Presentations: cephalic  Pelvic Exam:              Dilation: 1cm       Effacement: Long             Station:  -3    Consistency: medium            Position: posterior     Assessment:    Pregnancy 37 and 4/7 weeks  Pregnancy Induced Hypertension/CHTN History of PreE Plan:   Plans for delivery: Vaginal anticipated Beta strep culture: Positive Counseling: Infant feeding: plans to breastfeed. desires implenon for post partum birth control Will recollect her PIH labs and 24-hr urine No fetal testing indicated at this time due to [facility] BP's and not on meds; will cont to follow this and change per protcol Follow up in 1 Week.

## 2011-07-24 NOTE — Progress Notes (Signed)
Vaginal D/C: White, odorous

## 2011-07-24 NOTE — Patient Instructions (Signed)
Hipertensin en el embarazo Presin Arterial Elevada Durante el Embarazo (Hypertension In Pregnancy) Puede presentar presin alta (hipertensin) en la ltima mitad del embarazo (20 semanas o ms). Esto ocurre an si su presin arterial hubiera sido normal antes del embarazo. La presin alta en el embarazo puede ser seal de un trastorno llamado preeclampsia. Esto se percibe especialmente si presenta protenas en la orina. La preeclampsia puede no dar sntomas y hallarse slo en los exmenes de laboratorio y en el examen fsico. Si progresa, puede volverse eclampsia (convulsiones) o Sndrome de HELLP (anormalidades del hgado, problemas de coagulacin, mayor aumento de la presin, aumento de protenas en la orina). Ambas son enfermedades graves y Crissie Reese para la vida de la madre y del beb. Uana mujer que tiene presin alta antes de quedar embarazada tambin puede tener preclampsia sobreimpuesta. SOLICITE ATENCIN MDICA SI:  Dolor de estmago.   Hinchazn repentina y [employer], tobillos, o cara.   Aumenta de peso de 4 libras (1,8 kg) en una semana.   Vomita repetidas veces.   Sufre una hemorragia vaginal.   No percibe los movimientos del beb.   Dolor de [place].   Problemas en la visin (visin borrosa o doble).   Movimientos o espasmos musculares.   Falta de aire.   Labios o uas azulados.   Protenas en la orina.  Document Released: 11/04/2005 Document Re-Released: 04/24/2010 [facility] Patient Information 2011 [place], [place].

## 2011-07-26 LAB — PROTEIN, URINE, 24 HOUR: Protein, 24H Urine: 75 mg/d (ref 50–100)

## 2011-08-01 ENCOUNTER — Other Ambulatory Visit: Payer: Self-pay | Admitting: Family Medicine

## 2011-08-01 ENCOUNTER — Ambulatory Visit (INDEPENDENT_AMBULATORY_CARE_PROVIDER_SITE_OTHER): Payer: Self-pay | Admitting: Family Medicine

## 2011-08-01 ENCOUNTER — Encounter (HOSPITAL_COMMUNITY): Payer: Self-pay

## 2011-08-01 ENCOUNTER — Inpatient Hospital Stay (HOSPITAL_COMMUNITY)
Admission: AD | Admit: 2011-08-01 | Discharge: 2011-08-01 | Disposition: A | Discharge status: Home / Self Care | Payer: Self-pay | Source: Ambulatory Visit | Attending: Obstetrics & Gynecology | Admitting: Obstetrics & Gynecology

## 2011-08-01 DIAGNOSIS — O139 Gestational [pregnancy-induced] hypertension without significant proteinuria, unspecified trimester: Secondary | ICD-10-CM | POA: Insufficient documentation

## 2011-08-01 DIAGNOSIS — O0990 Supervision of high risk pregnancy, unspecified, unspecified trimester: Secondary | ICD-10-CM

## 2011-08-01 DIAGNOSIS — IMO0002 Reserved for concepts with insufficient information to code with codable children: Secondary | ICD-10-CM

## 2011-08-01 LAB — CBC
HCT: 37.3 % (ref 36.0–46.0)
Hemoglobin: 12.7 g/dL (ref 12.0–15.0)
MCV: 94.4 fL (ref 78.0–100.0)
WBC: 6.8 10*3/uL (ref 4.0–10.5)

## 2011-08-01 LAB — COMPREHENSIVE METABOLIC PANEL
Alkaline Phosphatase: 114 U/L (ref 39–117)
BUN: 8 mg/dL (ref 6–23)
Chloride: 100 mEq/L (ref 96–112)
Creatinine, Ser: <0.47 mg/dL — ABNORMAL LOW (ref 0.50–1.10)
Glucose, Bld: 74 mg/dL (ref 70–99)
Potassium: 4.1 mEq/L (ref 3.5–5.1)
Total Bilirubin: 0.1 mg/dL — ABNORMAL LOW (ref 0.3–1.2)

## 2011-08-01 LAB — PROTEIN / CREATININE RATIO, URINE
Creatinine, Urine: 151.18 mg/dL
Total Protein, Urine: 30.7 mg/dL

## 2011-08-01 NOTE — Progress Notes (Signed)
Pt was sent from the [facility] for evaluation of elevated BP. Pt denies any pain, bleeding or leaking and reports good fetal movement.

## 2011-08-01 NOTE — Progress Notes (Signed)
Pt sent from clinic for elevated b/p's, denies h/a, dizziness or blurred vision, no pain, +FM.

## 2011-08-01 NOTE — Progress Notes (Signed)
Used interpreter Delorise Royals. No vaginal discharge. Pulse 62.

## 2011-08-01 NOTE — Progress Notes (Signed)
Pt without complaints.  Denies HA, vision changes, abd pain.  Recheck BP same.  Will send to MAU for pre-eclampsia workup.

## 2011-08-01 NOTE — ED Provider Notes (Signed)
History     Chief Complaint  Patient presents with  . Hypertension   HPI Ms. Sharma Covert is a 25 year old G3P0202 at 24.5 by ultrasound presenting for pre-eclampsia rule out.  Patient came from [facility] where she had a blood pressure of >150/100.  Patient has gestational hypertension that has been controlled without medications so far. Patient has history of eclampsia with first pregnancy (delivered at 32 weeks) and gestational hypertension with 2nd pregnancy (delivered at 34 weeks).  Denies headache, vision changes, abdominal pain, edema of hands/face.  No contractions, loss of fluid, vaginal bleeding.  Reports good fetal movement.  OB History    Grav Para Term Preterm Abortions TAB SAB Ect Mult Living   3 2 0 2      2      Past Medical History  Diagnosis Date  . PIH (pregnancy induced hypertension) 2005/2008    1st and 2nd pregnancy  . Seizure     pt denies seizure hx.   . Preterm labor   . Pregnancy induced hypertension     Past Surgical History  Procedure Date  . No past surgeries     Family History  Problem Relation Age of Onset  . Diabetes Mother     History  Substance Use Topics  . Smoking status: Never Smoker   . Smokeless tobacco: Never Used  . Alcohol Use: No    Allergies: No Known Allergies  Prescriptions prior to admission  Medication Sig Dispense Refill  . acetaminophen (TYLENOL) 325 MG tablet Take 650 mg by mouth as needed.        . prenatal vitamin w/FE, FA (PRENATAL 1 + 1) 27-1 MG TABS Take 1 tablet by mouth daily.        . Prenatal Vit-Fe Psac Cmplx-FA (PRENATAL MULTIVITAMIN) 60-1 MG tablet Take 1 tablet by mouth daily.          ROS Negative except as in HPI. Physical Exam   Blood pressure 130/95, pulse 63, temperature 98.6 F (37 C), temperature source Oral, resp. rate 20, SpO2 99.00%.  Physical Exam  Constitutional: She is oriented to person, place, and time. She appears well-developed and well-nourished. No distress.  HENT:  Head:  Normocephalic and atraumatic.  Mouth/Throat: Oropharynx is clear and moist.  Eyes: No scleral icterus.  Neck: Normal range of motion. Neck supple.  Cardiovascular: Normal rate, regular rhythm, normal heart sounds and intact distal pulses.  Exam reveals no gallop.   No murmur heard. Respiratory: Effort normal and breath sounds normal. She has no wheezes. She has no rales.  GI: Bowel sounds are normal. There is no tenderness.       Gravid   Musculoskeletal: She exhibits no edema and no tenderness.  Neurological: She is alert and oriented to person, place, and time.  Skin: Skin is warm and dry.  Psychiatric: She has a normal mood and affect.   FHT: 135, moderate variability, accels present, decels absent Contractions: none Dilation: 1.5 Effacement (%): 50 Cervical Position: Posterior Station: -2 Presentation: Vertex Exam by:: Dr. Murray Hodgkins score: 6  MAU Course  Procedures CBC    Component Value Date/Time   WBC 6.8 08/01/2011 1015   RBC 3.95 08/01/2011 1015   HGB 12.7 08/01/2011 1015   HCT 37.3 08/01/2011 1015   PLT 205 08/01/2011 1015   MCV 94.4 08/01/2011 1015   MCH 32.2 08/01/2011 1015   MCHC 34.0 08/01/2011 1015   RDW 13.2 08/01/2011 1015   CMP     Component  Value Date/Time   NA 134* 08/01/2011 1015   K 4.1 08/01/2011 1015   CL 100 08/01/2011 1015   CO2 25 08/01/2011 1015   GLUCOSE 74 08/01/2011 1015   BUN 8 08/01/2011 1015   CREATININE <0.47* 08/01/2011 1015   CREATININE 0.51 07/24/2011 0922   CREATININE TEST NOT PERFORMED 07/18/2011 0800   CALCIUM 9.2 08/01/2011 1015   PROT 6.6 08/01/2011 1015   ALBUMIN 2.6* 08/01/2011 1015   AST 18 08/01/2011 1015   ALT 14 08/01/2011 1015   ALKPHOS 114 08/01/2011 1015   BILITOT 0.1* 08/01/2011 1015   GFRNONAA NOT CALCULATED 08/01/2011 1015   GFRAA NOT CALCULATED 08/01/2011 1015   Urine Pr/Cr: 0.20 Assessment and Plan  25 year old G3P0202 at 22.5 EGA with history of eclampsia and gestational hypertension presenting for pre-eclampsia rule  out. -serial BPs in MAU have been <140/100, urine Pr/Cr elevated but not pre-eclamptic. -discussed patient with Dr. Macon Large regarding induction of labor, scheduled for induction with pitocin on Saturday 9/15 at 8am. -discharge to home with labor and pre-eclampsia precautions  BOOTH, Seleta Hovland 08/01/2011, 11:16 AM

## 2011-08-02 ENCOUNTER — Encounter (HOSPITAL_COMMUNITY): Payer: Self-pay | Admitting: *Deleted

## 2011-08-02 ENCOUNTER — Telehealth (HOSPITAL_COMMUNITY): Payer: Self-pay | Admitting: *Deleted

## 2011-08-02 NOTE — Telephone Encounter (Signed)
Preadmission screen  

## 2011-08-03 ENCOUNTER — Inpatient Hospital Stay (HOSPITAL_COMMUNITY)
Admission: AD | Admit: 2011-08-03 | Discharge: 2011-08-05 | DRG: 775 | Disposition: A | Discharge status: Home / Self Care | Payer: Medicaid Other | Source: Ambulatory Visit | Attending: Obstetrics and Gynecology | Admitting: Obstetrics and Gynecology

## 2011-08-03 ENCOUNTER — Encounter (HOSPITAL_COMMUNITY): Payer: Self-pay

## 2011-08-03 DIAGNOSIS — O99892 Other specified diseases and conditions complicating childbirth: Secondary | ICD-10-CM

## 2011-08-03 DIAGNOSIS — O9989 Other specified diseases and conditions complicating pregnancy, childbirth and the puerperium: Secondary | ICD-10-CM

## 2011-08-03 DIAGNOSIS — O139 Gestational [pregnancy-induced] hypertension without significant proteinuria, unspecified trimester: Principal | ICD-10-CM | POA: Diagnosis present

## 2011-08-03 DIAGNOSIS — Z22330 Carrier of Group B streptococcus: Secondary | ICD-10-CM

## 2011-08-03 LAB — COMPREHENSIVE METABOLIC PANEL
Albumin: 2.6 g/dL — ABNORMAL LOW (ref 3.5–5.2)
Alkaline Phosphatase: 115 U/L (ref 39–117)
BUN: 12 mg/dL (ref 6–23)
Chloride: 101 mEq/L (ref 96–112)
Creatinine, Ser: <0.47 mg/dL — ABNORMAL LOW (ref 0.50–1.10)
Glucose, Bld: 75 mg/dL (ref 70–99)
Potassium: 3.9 mEq/L (ref 3.5–5.1)
Total Bilirubin: 0.3 mg/dL (ref 0.3–1.2)

## 2011-08-03 LAB — CBC
HCT: 37.2 % (ref 36.0–46.0)
MCH: 32.9 pg (ref 26.0–34.0)
MCV: 94.2 fL (ref 78.0–100.0)
Platelets: 218 10*3/uL (ref 150–400)
RBC: 3.95 MIL/uL (ref 3.87–5.11)
RDW: 13.3 % (ref 11.5–15.5)

## 2011-08-03 LAB — PROTEIN / CREATININE RATIO, URINE
Creatinine, Urine: 59.48 mg/dL
Total Protein, Urine: 10.1 mg/dL

## 2011-08-03 MED ORDER — CITRIC ACID-SODIUM CITRATE 334-500 MG/5ML PO SOLN: 30.0000 mL | ORAL | Status: DC | PRN

## 2011-08-03 MED ORDER — OXYTOCIN BOLUS FROM INFUSION
500.0000 mL | Freq: Once | INTRAVENOUS | Status: AC
  Administered 2011-08-03: 500 mL via INTRAVENOUS
  Filled 2011-08-03: qty 500

## 2011-08-03 MED ORDER — FLEET ENEMA 7-19 GM/118ML RE ENEM: 1.0000 | ENEMA | RECTAL | Status: DC | PRN

## 2011-08-03 MED ORDER — TERBUTALINE SULFATE 1 MG/ML IJ SOLN: 0.2500 mg | Freq: Once | INTRAMUSCULAR | Status: AC | PRN

## 2011-08-03 MED ORDER — IBUPROFEN 600 MG PO TABS
600.0000 mg | ORAL_TABLET | Freq: Four times a day (QID) | ORAL | Status: DC | PRN
  Administered 2011-08-03: 600 mg via ORAL
  Filled 2011-08-03: qty 1

## 2011-08-03 MED ORDER — OXYTOCIN 20 UNITS IN LACTATED RINGERS INFUSION - SIMPLE
125.0000 mL/h | Freq: Once | INTRAVENOUS | Status: AC
  Administered 2011-08-03: 999 mL/h via INTRAVENOUS

## 2011-08-03 MED ORDER — NALBUPHINE HCL 10 MG/ML IJ SOLN
10.0000 mg | Freq: Four times a day (QID) | INTRAMUSCULAR | Status: DC | PRN
  Filled 2011-08-03: qty 1

## 2011-08-03 MED ORDER — ACETAMINOPHEN 325 MG PO TABS: 650.0000 mg | ORAL_TABLET | ORAL | Status: DC | PRN

## 2011-08-03 MED ORDER — PENICILLIN G POTASSIUM 5000000 UNITS IJ SOLR
2.5000 10*6.[IU] | INTRAVENOUS | Status: DC
  Administered 2011-08-03 (×3): 2.5 10*6.[IU] via INTRAVENOUS
  Filled 2011-08-03 (×7): qty 2.5

## 2011-08-03 MED ORDER — OXYTOCIN 20 UNITS IN LACTATED RINGERS INFUSION - SIMPLE
1.0000 m[IU]/min | INTRAVENOUS | Status: DC
  Administered 2011-08-03: 8 m[IU]/min via INTRAVENOUS
  Administered 2011-08-03: 2 m[IU]/min via INTRAVENOUS
  Administered 2011-08-03: 6 m[IU]/min via INTRAVENOUS
  Filled 2011-08-03: qty 1000

## 2011-08-03 MED ORDER — LACTATED RINGERS IV SOLN: 500.0000 mL | INTRAVENOUS | Status: DC | PRN

## 2011-08-03 MED ORDER — LIDOCAINE HCL (PF) 1 % IJ SOLN
30.0000 mL | INTRAMUSCULAR | Status: DC | PRN
  Administered 2011-08-03: 30 mL via SUBCUTANEOUS
  Filled 2011-08-03: qty 30

## 2011-08-03 MED ORDER — OXYCODONE-ACETAMINOPHEN 5-325 MG PO TABS: 2.0000 | ORAL_TABLET | ORAL | Status: DC | PRN

## 2011-08-03 MED ORDER — PENICILLIN G POTASSIUM 5000000 UNITS IJ SOLR
5.0000 10*6.[IU] | Freq: Once | INTRAMUSCULAR | Status: AC
  Administered 2011-08-03: 5 10*6.[IU] via INTRAVENOUS
  Filled 2011-08-03: qty 5

## 2011-08-03 MED ORDER — ONDANSETRON HCL 4 MG/2ML IJ SOLN: 4.0000 mg | Freq: Four times a day (QID) | INTRAMUSCULAR | Status: DC | PRN

## 2011-08-03 MED ORDER — LACTATED RINGERS IV SOLN
INTRAVENOUS | Status: DC
  Administered 2011-08-03 (×2): via INTRAVENOUS

## 2011-08-03 MED ORDER — OXYTOCIN 10 UNIT/ML IJ SOLN
INTRAMUSCULAR | Status: AC
  Filled 2011-08-03: qty 2

## 2011-08-03 NOTE — Progress Notes (Signed)
[name] is a 25 y.o. [id number] at [redacted]w[redacted]d by LMP admitted for induction of labor due to Hypertension.  Subjective: No complaints. No pain with contractions.   Objective: BP 135/96  Pulse 64  Temp(Src) 97.9 F (36.6 C) (Oral)  Resp 18  Wt 186 lb (84.369 kg)     FHT:  FHR: 130 bpm, variability: moderate,  accelerations:  Present,  decelerations:  Present variable.  UC:   regular, every 2-2.5 minutes SVE:   Dilation: 2.5 Effacement (%): 60 Station: -3 Exam by:: dr Armen Pickup Foley bulb placed.  Labs: Lab Results  Component Value Date   WBC 6.4 08/03/2011   HGB 13.0 08/03/2011   HCT 37.2 08/03/2011   MCV 94.2 08/03/2011   PLT 218 08/03/2011    Assessment / Plan: Induction of labor due to gestational hypertension,  progressing well on pitocin  Labor: Progressing on Pitocin, foley bulb placed, will AROM once foley is delivered.  Preeclampsia:  n/a. Fetal Wellbeing:  Category I Pain Control:  Labor support without medications I/D:  PCN for GBS postive status.  Anticipated MOD:  NSVD  [name] 08/03/2011, 5:19 PM

## 2011-08-03 NOTE — Progress Notes (Signed)
[name] is a 25 y.o. [id number] at [redacted]w[redacted]d by ultrasound admitted for induction of labor due to Hypertension.  Subjective: No complaints. Feeling small pains in the lower abdomen.   Objective: BP 124/94  Pulse 75  Temp(Src) 98.4 F (36.9 C) (Oral)  Resp 18  Wt 186 lb (84.369 kg)      FHT:  FHR: 140 bpm, variability: moderate,  accelerations:  Present,  decelerations:  Absent UC:   regular, every 2-5 minutes SVE:   Dilation: 2.5 Effacement (%): 50 Station: -2 Exam by:: patti moore rn  Labs: Lab Results  Component Value Date   WBC 6.4 08/03/2011   HGB 13.0 08/03/2011   HCT 37.2 08/03/2011   MCV 94.2 08/03/2011   PLT 218 08/03/2011    Assessment / Plan: Induction of labor due to gestational hypertension,  progressing well on pitocin  Labor: Progressing normally Preeclampsia:  no evidence of pre-eclampsia. BP stable. Will f/u urine protein/creatinie and UA.  Fetal Wellbeing:  Category I Pain Control:  Labor support without medications I/D:  PCN hanging.  Anticipated MOD:  NSVD  [name] 08/03/2011, 1:05 PM

## 2011-08-03 NOTE — Progress Notes (Signed)
[name] is a 25 y.o. [id number] at [redacted]w[redacted]d by LMP admitted for induction of labor due to Hypertension.  Subjective: No complaints.  Objective: BP 136/91  Pulse 74  Temp(Src) 98.4 F (36.9 C) (Oral)  Resp 20  Wt 186 lb (84.369 kg)      FHT:  FHR: 130 bpm, variability: moderate,  accelerations:  Present,  decelerations:  Absent UC:   regular, every 2.5-4 minutes SVE:   Dilation: 2.5 Effacement (%): 60 Station: -3 Exam by:: dr Armen Pickup  Labs: Lab Results  Component Value Date   WBC 6.4 08/03/2011   HGB 13.0 08/03/2011   HCT 37.2 08/03/2011   MCV 94.2 08/03/2011   PLT 218 08/03/2011    Assessment / Plan: Induction of labor due to gestational hypertension,  progressing well on pitocin  Labor: Progressing on Pitocin, will continue to increase then AROM Preeclampsia:  n/a Fetal Wellbeing:  Category I Pain Control:  Labor support without medications I/D:  PCN. Anticipated MOD:  NSVD  [name] 08/03/2011, 2:56 PM

## 2011-08-03 NOTE — H&P (Signed)
Agree with above note.  [name] 08/03/2011 7:59 PM

## 2011-08-03 NOTE — H&P (Signed)
[name] is a 25 y.o. female presenting for induction of labor for gestational hypertension. Of note she has a history of eclampsia in her first pregnancy and pre-eclampsia in her second. Her care was transferred from the health department to the high risk clinic during this pregnancy for elevated blood pressure. She denies headache, chest pain, SOB, abdominal pain, contractions, vaginal bleeding, leakage of fluid from her vagina and lower extremity edema. She admits to positive fetal movement.   History OB History    Grav Para Term Preterm Abortions TAB SAB Ect Mult Living   3 2 0 2      2     Past Medical History  Diagnosis Date  . PIH (pregnancy induced hypertension) 2005/2008    1st and 2nd pregnancy  . Seizure     pt denies seizure hx.   . Preterm labor   . Pregnancy induced hypertension    Past Surgical History  Procedure Date  . No past surgeries    Family History: family history includes Diabetes in her mother. Social History:  reports that she has never smoked. She has never used smokeless tobacco. She reports that she does not drink alcohol or use illicit drugs.  ROS Negative, except as mentioned in HPI  Exam   Physical Exam  Nursing note and vitals reviewed. Constitutional: She is oriented to person, place, and time. She appears well-developed and well-nourished.  Cardiovascular: Normal rate and intact distal pulses.  Exam reveals gallop. Exam reveals no friction rub.   Respiratory: Effort normal and breath sounds normal. No respiratory distress.  GI:       Gravid, 6-6.5 lbs by Leopold's.   Genitourinary: Vagina normal.  Musculoskeletal: She exhibits edema.  Neurological: She is alert and oriented to person, place, and time.  Reflex Scores:      Brachioradialis reflexes are 1+ on the right side and 1+ on the left side.      Patellar reflexes are 1+ on the right side and 1+ on the left side.      No clonus  Skin: Skin is warm and dry.      Dilation: 1.5 Effacement (%): 60 Station: -2 Exam by:: dr Norris Brumbach Blood pressure 143/100, pulse 73, temperature 97.8 F (36.6 C), temperature source Oral, resp. rate 18, weight 186 lb (84.369 kg).  FHT: FHR: 135,  Marked variability, + accels, no decels.  Toco: none   Prenatal labs: ABO, Rh: O/Positive/-- (04/25 0000) Antibody: Negative (04/25 0000) Rubella:  Immune  RPR: Nonreactive, Nonreactive (04/25 0000)  HBsAg: Negative (04/25 0000)  HIV: Non-reactive (04/25 0000)  GBS: Positive (07/05 0000)   Assessment/Plan: 25 yo F presents for induction of labor for gestational hypertension.  1. Admit to L&D. 2. FWB: category 1.  3. Labor induction: pitocin. Will place foley/rupture membranes when cervical exam is stable. 4. HTN: patient currently does not meet criteria for pre-eclampsia. Will obtain blood work. Plan to treat persistently elevated BP >150/100) with IV labetolol 10-20 mg. Will start magnesium if patient meets criteria for pre-eclampsia.  5. GBS positive: treat with PCN.  6. Feeding: breast and bottle. 7. Contraception: undecided.   Lainie Daubert 08/03/2011, 9:36 AM

## 2011-08-04 MED ORDER — SIMETHICONE 80 MG PO CHEW: 80.0000 mg | CHEWABLE_TABLET | ORAL | Status: DC | PRN

## 2011-08-04 MED ORDER — BENZOCAINE-MENTHOL 20-0.5 % EX AERO
1.0000 "application " | INHALATION_SPRAY | CUTANEOUS | Status: DC | PRN
  Administered 2011-08-04: 1 via TOPICAL

## 2011-08-04 MED ORDER — ONDANSETRON HCL 4 MG PO TABS: 4.0000 mg | ORAL_TABLET | ORAL | Status: DC | PRN

## 2011-08-04 MED ORDER — BENZOCAINE-MENTHOL 20-0.5 % EX AERO
INHALATION_SPRAY | CUTANEOUS | Status: AC
  Filled 2011-08-04: qty 56

## 2011-08-04 MED ORDER — PRENATAL PLUS 27-1 MG PO TABS
1.0000 | ORAL_TABLET | Freq: Every day | ORAL | Status: DC
  Administered 2011-08-04 – 2011-08-05 (×2): 1 via ORAL
  Filled 2011-08-04 (×2): qty 1

## 2011-08-04 MED ORDER — DIBUCAINE 1 % RE OINT: 1.0000 "application " | TOPICAL_OINTMENT | RECTAL | Status: DC | PRN

## 2011-08-04 MED ORDER — ZOLPIDEM TARTRATE 5 MG PO TABS: 5.0000 mg | ORAL_TABLET | Freq: Every evening | ORAL | Status: DC | PRN

## 2011-08-04 MED ORDER — SENNOSIDES-DOCUSATE SODIUM 8.6-50 MG PO TABS
2.0000 | ORAL_TABLET | Freq: Every day | ORAL | Status: DC
  Administered 2011-08-04: 2 via ORAL

## 2011-08-04 MED ORDER — INFLUENZA VIRUS VACC SPLIT PF IM SUSP
0.5000 mL | Freq: Once | INTRAMUSCULAR | Status: AC
  Administered 2011-08-05: 0.5 mL via INTRAMUSCULAR
  Filled 2011-08-04: qty 0.5

## 2011-08-04 MED ORDER — OXYCODONE-ACETAMINOPHEN 5-325 MG PO TABS: 1.0000 | ORAL_TABLET | ORAL | Status: DC | PRN

## 2011-08-04 MED ORDER — DIPHENHYDRAMINE HCL 25 MG PO CAPS: 25.0000 mg | ORAL_CAPSULE | Freq: Four times a day (QID) | ORAL | Status: DC | PRN

## 2011-08-04 MED ORDER — IBUPROFEN 600 MG PO TABS
600.0000 mg | ORAL_TABLET | Freq: Four times a day (QID) | ORAL | Status: DC
  Administered 2011-08-04 – 2011-08-05 (×6): 600 mg via ORAL
  Filled 2011-08-04 (×7): qty 1

## 2011-08-04 MED ORDER — LANOLIN HYDROUS EX OINT: TOPICAL_OINTMENT | CUTANEOUS | Status: DC | PRN

## 2011-08-04 MED ORDER — TETANUS-DIPHTH-ACELL PERTUSSIS 5-2.5-18.5 LF-MCG/0.5 IM SUSP
0.5000 mL | Freq: Once | INTRAMUSCULAR | Status: AC
  Administered 2011-08-04: 0.5 mL via INTRAMUSCULAR
  Filled 2011-08-04: qty 0.5

## 2011-08-04 MED ORDER — ONDANSETRON HCL 4 MG/2ML IJ SOLN: 4.0000 mg | INTRAMUSCULAR | Status: DC | PRN

## 2011-08-04 MED ORDER — WITCH HAZEL-GLYCERIN EX PADS: 1.0000 "application " | MEDICATED_PAD | CUTANEOUS | Status: DC | PRN

## 2011-08-04 NOTE — Progress Notes (Signed)
Post Partum Day 1 Subjective: no complaints, up ad lib, voiding and tolerating PO  Objective: Blood pressure 127/81, pulse 72, temperature 98.7 F (37.1 C), temperature source Oral, resp. rate 18, weight 84.369 kg (186 lb), unknown if currently breastfeeding.  Physical Exam:  General: alert, cooperative, appears stated age and no distress Lochia: appropriate Uterine Fundus: firm Incision: n/a DVT Evaluation: No evidence of DVT seen on physical exam. Negative Homan's sign. No cords or calf tenderness. No significant calf/ankle edema.   Basename 08/03/11 0915 08/01/11 1015  HGB 13.0 12.7  HCT 37.2 37.3    Assessment/Plan: Plan for discharge tomorrow and and and   LOS: 1 day   Zerita Boers 08/04/2011, 8:48 AM

## 2011-08-05 MED ORDER — OXYCODONE-ACETAMINOPHEN 5-325 MG PO TABS: 1.0000 | ORAL_TABLET | ORAL | Status: AC | PRN

## 2011-08-05 MED ORDER — IBUPROFEN 600 MG PO TABS: 600.0000 mg | ORAL_TABLET | Freq: Four times a day (QID) | ORAL | Status: AC

## 2011-08-05 NOTE — Progress Notes (Signed)
UR chart review completed.  

## 2011-08-05 NOTE — Progress Notes (Signed)
Post Partum Day 2 Subjective: no complaints, up ad lib, voiding, tolerating PO and + flatus  Objective: Blood pressure 123/80, pulse 79, temperature 98 F (36.7 C), temperature source Oral, resp. rate 18, weight 84.369 kg (186 lb), unknown if currently breastfeeding.  Physical Exam:  General: alert, cooperative, appears stated age and no distress Lochia: appropriate Uterine Fundus: firm Incision: n/a DVT Evaluation: No evidence of DVT seen on physical exam. Negative Homan's sign. No cords or calf tenderness. No significant calf/ankle edema.   Basename 08/03/11 0915  HGB 13.0  HCT 37.2    Assessment/Plan: Discharge home   LOS: 2 days   Zerita Boers 08/05/2011, 6:36 AM

## 2011-08-05 NOTE — Discharge Summary (Signed)
Obstetric Discharge Summary Reason for Admission: onset of labor Prenatal Procedures: ultrasound Intrapartum Procedures: spontaneous vaginal delivery Postpartum Procedures: none Complications-Operative and Postpartum: none Hemoglobin  Date Value Range Status  08/03/2011 13.0  12.0-15.0 (g/dL) Final     HCT  Date Value Range Status  08/03/2011 37.2  36.0-46.0 (%) Final    Discharge Diagnoses: Term Pregnancy-delivered  Discharge Information: Date: 08/05/2011 Activity: pelvic rest Diet: routine Medications: PNV, Ibuprophen and Percocet Condition: stable and improved Instructions: refer to practice specific booklet Discharge to: home   Newborn Data: Live born female  Birth Weight: 5 lb 6.2 oz (2445 g) APGAR: 8, 9  Home with mother.  Zerita Boers 08/05/2011, 6:38 AM

## 2011-08-18 NOTE — ED Provider Notes (Signed)
Agree with note above. MUHAMMAD,WALIDAH 

## 2011-09-05 LAB — CBC
HCT: 26 — ABNORMAL LOW
HCT: 35.1 — ABNORMAL LOW
Hemoglobin: 11.8 — ABNORMAL LOW
MCHC: 33.6
MCHC: 33.8
MCV: 94.8
RBC: 2.74 — ABNORMAL LOW
RBC: 3.15 — ABNORMAL LOW
RDW: 14.1 — ABNORMAL HIGH
WBC: 12.7 — ABNORMAL HIGH

## 2011-09-05 LAB — COMPREHENSIVE METABOLIC PANEL
ALT: 12
AST: 22
Albumin: 2 — ABNORMAL LOW
BUN: 6
CO2: 23
Calcium: 8.2 — ABNORMAL LOW
Chloride: 104
Creatinine, Ser: 0.52
GFR calc Af Amer: >60
GFR calc non Af Amer: >60
Glucose, Bld: 79
Sodium: 133 — ABNORMAL LOW
Sodium: 133 — ABNORMAL LOW
Total Bilirubin: 0.3
Total Protein: 5.9 — ABNORMAL LOW

## 2011-09-05 LAB — URIC ACID: Uric Acid, Serum: 5.6

## 2011-09-05 LAB — POCT URINALYSIS DIP (DEVICE)
Hgb urine dipstick: NEGATIVE
Hgb urine dipstick: NEGATIVE
Nitrite: NEGATIVE
Nitrite: NEGATIVE
pH: 6.5
pH: 6.5

## 2011-09-05 LAB — LACTATE DEHYDROGENASE: LDH: 157

## 2011-09-05 LAB — RPR: RPR Ser Ql: NONREACTIVE

## 2011-09-12 ENCOUNTER — Ambulatory Visit: Payer: Self-pay | Admitting: Obstetrics and Gynecology

## 2014-09-19 ENCOUNTER — Encounter (HOSPITAL_COMMUNITY): Payer: Self-pay

## 2016-11-18 NOTE — L&D Delivery Note (Signed)
Delivery Note At  a viable female was delivered via  (Presentation:vertex ; OA ).  APGAR: 8, ; weight  .   Placenta status:stont  , shultz.  Cord:3vc  with the following complications: none .  Cord pH: n/a  Anesthesia: none  Episiotomy:  none Lacerations:  1 degree perineal Suture Repair: none Est. Blood Loss 400(mL):    Mom to postpartum.  Baby to Couplet care / Skin to Skin.  [name] 10/19/2017, 12:42 PM

## 2017-04-10 LAB — CULTURE, OB URINE: Urine Culture, OB: NEGATIVE

## 2017-04-10 LAB — OB RESULTS CONSOLE RUBELLA ANTIBODY, IGM: RUBELLA: IMMUNE

## 2017-04-10 LAB — OB RESULTS CONSOLE GC/CHLAMYDIA: GC PROBE AMP, GENITAL: NEGATIVE

## 2017-04-10 LAB — OB RESULTS CONSOLE HGB/HCT, BLOOD
HCT: 37
Hemoglobin: 11.7

## 2017-04-10 LAB — OB RESULTS CONSOLE VARICELLA ZOSTER ANTIBODY, IGG: VARICELLA IGG: IMMUNE

## 2017-04-10 LAB — OB RESULTS CONSOLE TB SKIN TEST: CHL TB SkinTest: NEGATIVE

## 2017-04-10 LAB — OB RESULTS CONSOLE HIV ANTIBODY (ROUTINE TESTING): HIV: NONREACTIVE

## 2017-04-10 LAB — OB RESULTS CONSOLE PLATELET COUNT: PLATELETS: 273

## 2017-04-10 LAB — CYTOLOGY - PAP: Pap: NEGATIVE

## 2017-05-06 ENCOUNTER — Encounter: Payer: Self-pay | Admitting: *Deleted

## 2017-05-08 ENCOUNTER — Ambulatory Visit (INDEPENDENT_AMBULATORY_CARE_PROVIDER_SITE_OTHER): Payer: Medicaid Other | Admitting: Obstetrics and Gynecology

## 2017-05-08 ENCOUNTER — Encounter: Payer: Self-pay | Admitting: Obstetrics and Gynecology

## 2017-05-08 VITALS — BP 108/68 | HR 77 | Wt 167.2 lb

## 2017-05-08 DIAGNOSIS — Z8759 Personal history of other complications of pregnancy, childbirth and the puerperium: Secondary | ICD-10-CM | POA: Insufficient documentation

## 2017-05-08 DIAGNOSIS — O2202 Varicose veins of lower extremity in pregnancy, second trimester: Secondary | ICD-10-CM | POA: Insufficient documentation

## 2017-05-08 DIAGNOSIS — Z789 Other specified health status: Secondary | ICD-10-CM | POA: Insufficient documentation

## 2017-05-08 DIAGNOSIS — O0992 Supervision of high risk pregnancy, unspecified, second trimester: Secondary | ICD-10-CM

## 2017-05-08 DIAGNOSIS — Z87898 Personal history of other specified conditions: Secondary | ICD-10-CM

## 2017-05-08 DIAGNOSIS — Z3689 Encounter for other specified antenatal screening: Secondary | ICD-10-CM

## 2017-05-08 DIAGNOSIS — Z603 Acculturation difficulty: Secondary | ICD-10-CM | POA: Insufficient documentation

## 2017-05-08 DIAGNOSIS — Z8751 Personal history of pre-term labor: Secondary | ICD-10-CM | POA: Insufficient documentation

## 2017-05-08 NOTE — Progress Notes (Signed)
New OB Note  05/08/2017   [facility]  [facility]  History of Present Illness: Ms. [name] is a 31 y.o. [id number] @ [facility] weeks [facility]], based on Patient's last menstrual period was 01/12/2017 (approximate).).  Preg complicated by has History of eclampsia; History of low birth weight; Supervision of high risk pregnancy, antepartum, second trimester; and History of preterm delivery on her problem list.   Any events prior to today's visit: no  ROS: A 12-point review of systems was performed and negative, except as stated in the above HPI.  OBGYN History: As per HPI. OB History  Gravida Para Term Preterm AB Living  4 3 1 2  0 3  SAB TAB Ectopic Multiple Live Births  0 0 0 0 3    # Outcome Date GA Lbr Len/2nd Weight Sex Delivery Anes PTL Lv  4 Current           3 Term 08/03/11 1521w0d 03:43 / 00:09 5 lb 6.2 oz (2.445 kg) M Vag-Spont None  LIV  2 Preterm 05/01/07 8438w0d  5 lb 7.8 oz (2.489 kg) F Vag-Spont None Y LIV     Birth Comments: High Blood Pressure  1 Preterm 09/20/04 3858w0d  3 lb (1.361 kg) F Vag-Spont None Y LIV     Birth Comments: Eclampsia,Pre-eclampsia, seizure, high blood pressure    Obstetric Comments  Iatrogenic PTB x 2 (eclampsia and severe pre-eclampsia)   Pap NILM 2018   Past Medical History: Past Medical History:  Diagnosis Date  . H/O eclampsia   . History of severe pre-eclampsia 2005/2008   1st and 2nd pregnancy    Past Surgical History: Past Surgical History:  Procedure Laterality Date  . NO PAST SURGERIES      Family History:  Family History  Problem Relation Age of Onset  . Diabetes Mother     Social History:  Social History   Social History  . Marital status: Single    Spouse name: N/A  . Number of children: N/A  . Years of education: N/A   Occupational History  . Not on file.   Social History Main Topics  . Smoking status: Never  Smoker  . Smokeless tobacco: Never Used  . Alcohol use No  . Drug use: No  . Sexual activity: Yes    Birth control/ protection: None   Other Topics Concern  . Not on file   Social History Narrative  . No narrative on file   Allergy: No Known Allergies  Current Outpatient Medications: PNV, ASA 81mg    Physical Exam:   BP 108/68   Pulse 77   Wt 167 lb 3.2 oz (75.8 kg)   LMP 01/12/2017 (Approximate)   BMI 32.12 kg/m  Body mass index is 32.12 kg/m. Contractions: Not present Vag. Bleeding: None. Fundal height: n/a FHTs: 150s  General appearance: Well nourished, well developed female in no acute distress.   Laboratory: [facility] labs 03/2017 O pos/rub imm/var imm/rpr neg/hiv neg/hepB surf ag neg/hemoglobinoatphy neg/CF neg/1hr neg/GC-CT neg/UDS neg/UCx neg/U-A neg/CBC neg  Imaging:  none  Assessment: pt doing well  Plan: 1. History of eclampsia Continue baby ASA. Will get baseline labs today. Recommend considering qwk visits (alternate rob and rn bp checks) starting at 28wks - Protein / Creatinine Ratio, Urine - Comprehensive metabolic panel  2. Supervision of high risk pregnancy in second trimester Routine care. Offer afp tetra after dates confirmed.  - US MFM OB DETAIL +14  WK; Future  3. H/o LBW Recommend serial q4-6wk growth u/s starting at approx 24wks.  Interpreter used  Problem list reviewed and updated.  Follow up in 3 weeks.   >50% of 20 min visit spent on counseling and coordination of care.     Cornelia Copa MD Attending Center [facility])

## 2017-05-09 LAB — COMPREHENSIVE METABOLIC PANEL
ALBUMIN: 3.5 g/dL (ref 3.5–5.5)
ALT: 36 IU/L — ABNORMAL HIGH (ref 0–32)
AST: 32 IU/L (ref 0–40)
Albumin/Globulin Ratio: 1.4 (ref 1.2–2.2)
Alkaline Phosphatase: 56 IU/L (ref 39–117)
BUN / CREAT RATIO: 14 (ref 9–23)
BUN: 7 mg/dL (ref 6–20)
Bilirubin Total: <0.2 mg/dL (ref 0.0–1.2)
CALCIUM: 8.8 mg/dL (ref 8.7–10.2)
CO2: 20 mmol/L (ref 20–29)
CREATININE: 0.5 mg/dL — AB (ref 0.57–1.00)
Chloride: 103 mmol/L (ref 96–106)
GFR calc Af Amer: 148 mL/min/{1.73_m2} (ref >59)
GFR, EST NON AFRICAN AMERICAN: 128 mL/min/{1.73_m2} (ref >59)
GLOBULIN, TOTAL: 2.5 g/dL (ref 1.5–4.5)
Glucose: 90 mg/dL (ref 65–99)
Potassium: 4.1 mmol/L (ref 3.5–5.2)
SODIUM: 138 mmol/L (ref 134–144)
Total Protein: 6 g/dL (ref 6.0–8.5)

## 2017-05-09 LAB — PROTEIN / CREATININE RATIO, URINE
Creatinine, Urine: 39.3 mg/dL
Protein, Ur: 9 mg/dL
Protein/Creat Ratio: 229 mg/g creat — ABNORMAL HIGH (ref 0–200)

## 2017-05-13 ENCOUNTER — Encounter: Payer: Self-pay | Admitting: *Deleted

## 2017-05-22 ENCOUNTER — Ambulatory Visit (HOSPITAL_COMMUNITY)
Admission: RE | Admit: 2017-05-22 | Discharge: 2017-05-22 | Disposition: A | Discharge status: Home / Self Care | Payer: Self-pay | Source: Ambulatory Visit | Attending: Obstetrics and Gynecology | Admitting: Obstetrics and Gynecology

## 2017-05-22 ENCOUNTER — Encounter (HOSPITAL_COMMUNITY): Payer: Self-pay

## 2017-05-22 ENCOUNTER — Other Ambulatory Visit: Payer: Self-pay | Admitting: Obstetrics and Gynecology

## 2017-05-22 DIAGNOSIS — O99212 Obesity complicating pregnancy, second trimester: Secondary | ICD-10-CM | POA: Insufficient documentation

## 2017-05-22 DIAGNOSIS — O09899 Supervision of other high risk pregnancies, unspecified trimester: Secondary | ICD-10-CM

## 2017-05-22 DIAGNOSIS — O09212 Supervision of pregnancy with history of pre-term labor, second trimester: Secondary | ICD-10-CM | POA: Insufficient documentation

## 2017-05-22 DIAGNOSIS — O0992 Supervision of high risk pregnancy, unspecified, second trimester: Secondary | ICD-10-CM

## 2017-05-22 DIAGNOSIS — Z3689 Encounter for other specified antenatal screening: Secondary | ICD-10-CM | POA: Insufficient documentation

## 2017-05-22 DIAGNOSIS — Z3A19 19 weeks gestation of pregnancy: Secondary | ICD-10-CM

## 2017-05-22 DIAGNOSIS — Z3687 Encounter for antenatal screening for uncertain dates: Secondary | ICD-10-CM

## 2017-05-22 DIAGNOSIS — O09299 Supervision of pregnancy with other poor reproductive or obstetric history, unspecified trimester: Secondary | ICD-10-CM

## 2017-05-22 DIAGNOSIS — O09219 Supervision of pregnancy with history of pre-term labor, unspecified trimester: Secondary | ICD-10-CM

## 2017-05-22 DIAGNOSIS — Z6829 Body mass index (BMI) 29.0-29.9, adult: Secondary | ICD-10-CM | POA: Insufficient documentation

## 2017-05-22 DIAGNOSIS — O09292 Supervision of pregnancy with other poor reproductive or obstetric history, second trimester: Secondary | ICD-10-CM | POA: Insufficient documentation

## 2017-05-22 DIAGNOSIS — E669 Obesity, unspecified: Secondary | ICD-10-CM | POA: Insufficient documentation

## 2017-05-29 ENCOUNTER — Ambulatory Visit (INDEPENDENT_AMBULATORY_CARE_PROVIDER_SITE_OTHER): Payer: Self-pay | Admitting: Family Medicine

## 2017-05-29 VITALS — BP 121/68 | HR 74 | Wt 167.0 lb

## 2017-05-29 DIAGNOSIS — O0992 Supervision of high risk pregnancy, unspecified, second trimester: Secondary | ICD-10-CM

## 2017-05-29 DIAGNOSIS — O2202 Varicose veins of lower extremity in pregnancy, second trimester: Secondary | ICD-10-CM

## 2017-05-29 DIAGNOSIS — Z8751 Personal history of pre-term labor: Secondary | ICD-10-CM

## 2017-05-29 DIAGNOSIS — N898 Other specified noninflammatory disorders of vagina: Secondary | ICD-10-CM

## 2017-05-29 DIAGNOSIS — O09212 Supervision of pregnancy with history of pre-term labor, second trimester: Secondary | ICD-10-CM

## 2017-05-29 MED ORDER — TERCONAZOLE 0.8 % VA CREA: 1.0000 | TOPICAL_CREAM | Freq: Every day | VAGINAL | 0 refills | Status: DC

## 2017-05-29 NOTE — Patient Instructions (Signed)
Segundo trimestre de embarazo (Second Trimester of Pregnancy) El segundo trimestre va desde la semana13 hasta la 28, desde el cuarto hasta el sexto mes, y suele ser el momento en el que mejor se siente. Su organismo se ha adaptado a estar embarazada y comienza a sentirse fsicamente mejor. En general, las nuseas matutinas han disminuido o han desaparecido completamente, puede tener ms energa y un aumento de apetito. El segundo trimestre es tambin la poca en la que el feto se desarrolla rpidamente. Hacia el final del sexto mes, el feto mide aproximadamente 9pulgadas (23cm) y pesa alrededor de 1 libras (700g). Es probable que sienta que el beb se mueve (da pataditas) entre las 18 y 20semanas del embarazo. CAMBIOS EN EL ORGANISMO Su organismo atraviesa por muchos cambios durante el embarazo, y estos varan de una mujer a otra.  Seguir aumentando de peso. Notar que la parte baja del abdomen sobresale.  Podrn aparecer las primeras estras en las caderas, el abdomen y las mamas.  Es posible que tenga dolores de cabeza que pueden aliviarse con los medicamentos que el mdico le permita tomar.  Tal vez tenga necesidad de orinar con ms frecuencia porque el feto est ejerciendo presin sobre la vejiga.  Debido al embarazo podr sentir acidez estomacal con frecuencia.  Puede estar estreida, ya que ciertas hormonas enlentecen los movimientos de los msculos que empujan los desechos a travs de los intestinos.  Pueden aparecer hemorroides o abultarse e hincharse las venas (venas varicosas).  Puede tener dolor de espalda que se debe al aumento de peso y a que las hormonas del embarazo relajan las articulaciones entre los huesos de la pelvis, y como consecuencia de la modificacin del peso y los msculos que mantienen el equilibrio.  Las mamas seguirn creciendo y le dolern.  Las encas pueden sangrar y estar sensibles al cepillado y al hilo dental.  Pueden aparecer zonas oscuras o  manchas (cloasma, mscara del embarazo) en el rostro que probablemente se atenuar despus del nacimiento del beb.  Es posible que se forme una lnea oscura desde el ombligo hasta la zona del pubis (linea nigra) que probablemente se atenuar despus del nacimiento del beb.  Tal vez haya cambios en el cabello que pueden incluir su engrosamiento, crecimiento rpido y cambios en la textura. Adems, a algunas mujeres se les cae el cabello durante o despus del embarazo, o tienen el cabello seco o fino. Lo ms probable es que el cabello se le normalice despus del nacimiento del beb. QU DEBE ESPERAR EN LAS CONSULTAS PRENATALES Durante una visita prenatal de rutina:  La pesarn para asegurarse de que usted y el feto estn creciendo normalmente.  Le tomarn la presin arterial.  Le medirn el abdomen para controlar el desarrollo del beb.  Se escucharn los latidos cardacos fetales.  Se evaluarn los resultados de los estudios solicitados en visitas anteriores. El mdico puede preguntarle lo siguiente:  Cmo se siente.  Si siente los movimientos del beb.  Si ha tenido sntomas anormales, como prdida de lquido, sangrado, dolores de cabeza intensos o clicos abdominales.  Si est consumiendo algn producto que contenga tabaco, como cigarrillos, tabaco de mascar y cigarrillos electrnicos.  Si tiene alguna pregunta. Otros estudios que podrn realizarse durante el segundo trimestre incluyen lo siguiente:  Anlisis de sangre para detectar lo siguiente:  Concentraciones de hierro bajas (anemia).  Diabetes gestacional (entre la semana 24 y la 28).  Anticuerpos Rh.  Anlisis de orina para detectar infecciones, diabetes o protenas en la orina.    Una ecografa para confirmar que el beb crece y se desarrolla correctamente.  Una amniocentesis para diagnosticar posibles problemas genticos.  Estudios del feto para descartar espina bfida y sndrome de Down.  Prueba del VIH (virus  de inmunodeficiencia humana). Los exmenes prenatales de rutina incluyen la prueba de deteccin del VIH, a menos que decida no realizrsela. INSTRUCCIONES PARA EL CUIDADO EN EL HOGAR  Evite fumar, consumir hierbas, beber alcohol y tomar frmacos que no le hayan recetado. Estas sustancias qumicas afectan la formacin y el desarrollo del beb.  No consuma ningn producto que contenga tabaco, lo que incluye cigarrillos, tabaco de mascar y cigarrillos electrnicos. Si necesita ayuda para dejar de fumar, consulte al mdico. Puede recibir asesoramiento y otro tipo de recursos para dejar de fumar.  Siga las indicaciones del mdico en relacin con el uso de medicamentos. Durante el embarazo, hay medicamentos que son seguros de tomar y otros que no.  Haga ejercicio solamente como se lo haya indicado el mdico. Sentir clicos uterinos es un buen signo para detener la actividad fsica.  Contine comiendo alimentos sanos con regularidad.  Use un sostn que le brinde buen soporte si le duelen las mamas.  No se d baos de inmersin en agua caliente, baos turcos ni saunas.  Use el cinturn de seguridad en todo momento mientras conduce.  No coma carne cruda ni queso sin cocinar; evite el contacto con las bandejas sanitarias de los gatos y la tierra que estos animales usan. Estos elementos contienen grmenes que pueden causar defectos congnitos en el beb.  Tome las vitaminas prenatales.  Tome entre 1500 y 2000mg de calcio diariamente comenzando en la semana20 del embarazo hasta el parto.  Si est estreida, pruebe un laxante suave (si el mdico lo autoriza). Consuma ms alimentos ricos en fibra, como vegetales y frutas frescos y cereales integrales. Beba gran cantidad de lquido para mantener la orina de tono claro o color amarillo plido.  Dese baos de asiento con agua tibia para aliviar el dolor o las molestias causadas por las hemorroides. Use una crema para las hemorroides si el mdico la  autoriza.  Si tiene venas varicosas, use medias de descanso. Eleve los pies durante 15minutos, 3 o 4veces por da. Limite el consumo de sal en su dieta.  No levante objetos pesados, use zapatos de tacones bajos y mantenga una buena postura.  Descanse con las piernas elevadas si tiene calambres o dolor de cintura.  Visite a su dentista si an no lo ha hecho durante el embarazo. Use un cepillo de dientes blando para higienizarse los dientes y psese el hilo dental con suavidad.  Puede seguir manteniendo relaciones sexuales, a menos que el mdico le indique lo contrario.  Concurra a todas las visitas prenatales segn las indicaciones de su mdico. SOLICITE ATENCIN MDICA SI:  Tiene mareos.  Siente clicos leves, presin en la pelvis o dolor persistente en el abdomen.  Tiene nuseas, vmitos o diarrea persistentes.  Observa una secrecin vaginal con mal olor.  Siente dolor al orinar. SOLICITE ATENCIN MDICA DE INMEDIATO SI:  Tiene fiebre.  Tiene una prdida de lquido por la vagina.  Tiene sangrado o pequeas prdidas vaginales.  Siente dolor intenso o clicos en el abdomen.  Sube o baja de peso rpidamente.  Tiene dificultad para respirar y siente dolor de pecho.  Sbitamente se le hinchan mucho el rostro, las manos, los tobillos, los pies o las piernas.  No ha sentido los movimientos del beb durante una hora.  Siente un   dolor de cabeza intenso que no se alivia con medicamentos.  Su visin se modifica. Esta informacin no tiene como fin reemplazar el consejo del mdico. Asegrese de hacerle al mdico cualquier pregunta que tenga. Document Released: 08/14/2005 Document Revised: 11/25/2014 Document Reviewed: 01/05/2013 Elsevier Interactive Patient Education  2017 [employer]   Lactancia materna (Breastfeeding) Decidir amamantar es una de las mejores elecciones que puede hacer por usted y su beb. El cambio hormonal durante el embarazo produce el desarrollo del  tejido mamario y aumenta la cantidad y el tamao de los conductos galactforos. Estas hormonas tambin permiten que las protenas, los azcares y las grasas de la sangre produzcan la leche materna en las glndulas productoras de leche. Las hormonas impiden que la leche materna sea liberada antes del nacimiento del beb, adems de impulsar el flujo de leche luego del nacimiento. Una vez que ha comenzado a amamantar, pensar en el beb, as como la succin o el llanto, pueden estimular la liberacin de leche de las glndulas productoras de leche. LOS BENEFICIOS DE AMAMANTAR Para el beb   La primera leche (calostro) ayuda a mejorar el funcionamiento del sistema digestivo del beb.  La leche tiene anticuerpos que ayudan a prevenir las infecciones en el beb.  El beb tiene una menor incidencia de asma, alergias y del sndrome de muerte sbita del lactante.  Los nutrientes en la leche materna son mejores para el beb que la leche maternizada y estn preparados exclusivamente para cubrir las necesidades del beb.  La leche materna mejora el desarrollo cerebral del beb.  Es menos probable que el beb desarrolle otras enfermedades, como obesidad infantil, asma o diabetes mellitus de tipo 2. Para usted   La lactancia materna favorece el desarrollo de un vnculo muy especial entre la madre y el beb.  Es conveniente. La leche materna siempre est disponible a la temperatura correcta y es econmica.  La lactancia materna ayuda a quemar caloras y a perder el peso ganado durante el embarazo.  Favorece la contraccin del tero al tamao que tena antes del embarazo de manera ms rpida y disminuye el sangrado (loquios) despus del parto.  La lactancia materna contribuye a reducir el riesgo de desarrollar diabetes mellitus de tipo 2, osteoporosis o cncer de mama o de ovario en el futuro. SIGNOS DE QUE EL BEB EST HAMBRIENTO Primeros signos de hambre   Aumenta su estado de alerta o actividad.  Se  estira.  Mueve la cabeza de un lado a otro.  Mueve la cabeza y abre la boca cuando se le toca la mejilla o la comisura de la boca (reflejo de bsqueda).  Aumenta las vocalizaciones, tales como sonidos de succin, se relame los labios, emite arrullos, suspiros, o chirridos.  Mueve la mano hacia la boca.  Se chupa con ganas los dedos o las manos. Signos tardos de hambre   Est agitado.  Llora de manera intermitente. Signos de hambre extrema  Los signos de hambre extrema requerirn que lo calme y lo consuele antes de que el beb pueda alimentarse adecuadamente. No espere a que se manifiesten los siguientes signos de hambre extrema para comenzar a amamantar:  Agitacin.  Llanto intenso y fuerte.  Gritos. INFORMACIN BSICA SOBRE LA LACTANCIA MATERNA Iniciacin de la lactancia materna   Encuentre un lugar cmodo para sentarse o acostarse, con un buen respaldo para el cuello y la espalda.  Coloque una almohada o una manta enrollada debajo del beb para acomodarlo a la altura de la mama (si est sentada).   Las almohadas para amamantar se han diseado especialmente a fin de servir de apoyo para los brazos y el beb mientras amamanta.  Asegrese de que el abdomen del beb est frente al suyo.  Masajee suavemente la mama. Con las yemas de los dedos, masajee la pared del pecho hacia el pezn en un movimiento circular. Esto estimula el flujo de leche. Es posible que deba continuar este movimiento mientras amamanta si la leche fluye lentamente.  Sostenga la mama con el pulgar por arriba del pezn y los otros 4 dedos por debajo de la mama. Asegrese de que los dedos se encuentren lejos del pezn y de la boca del beb.  Empuje suavemente los labios del beb con el pezn o con el dedo.  Cuando la boca del beb se abra lo suficiente, acrquelo rpidamente a la mama e introduzca todo el pezn y la zona oscura que lo rodea (areola), tanto como sea posible, dentro de la boca del beb.  Debe  haber ms areola visible por arriba del labio superior del beb que por debajo del labio inferior.  La lengua del beb debe estar entre la enca inferior y la mama.  Asegrese de que la boca del beb est en la posicin correcta alrededor del pezn (prendida). Los labios del beb deben crear un sello sobre la mama y estar doblados hacia afuera (invertidos).  Es comn que el beb succione durante 2 a 3 minutos para que comience el flujo de leche materna. Cmo debe prenderse  Es muy importante que le ensee al beb cmo prenderse adecuadamente a la mama. Si el beb no se prende adecuadamente, puede causarle dolor en el pezn y reducir la produccin de leche materna, y hacer que el beb tenga un escaso aumento de peso. Adems, si el beb no se prende adecuadamente al pezn, puede tragar aire durante la alimentacin. Esto puede causarle molestias al beb. Hacer eructar al beb al cambiar de mama puede ayudarlo a liberar el aire. Sin embargo, ensearle al beb cmo prenderse a la mama adecuadamente es la mejor manera de evitar que se sienta molesto por tragar aire mientras se alimenta. Signos de que el beb se ha prendido adecuadamente al pezn:  Tironea o succiona de modo silencioso, sin causarle dolor.  Se escucha que traga cada 3 o 4 succiones.  Hay movimientos musculares por arriba y por delante de sus odos al succionar. Signos de que el beb no se ha prendido adecuadamente al pezn:  Hace ruidos de succin o de chasquido mientras se alimenta.  Siente dolor en el pezn. Si cree que el beb no se prendi correctamente, deslice el dedo en la comisura de la boca y colquelo entre las encas del beb para interrumpir la succin. Intente comenzar a amamantar nuevamente. Signos de lactancia materna exitosa  Signos del beb:  Disminuye gradualmente el nmero de succiones o cesa la succin por completo.  Se duerme.  Relaja el cuerpo.  Retiene una pequea cantidad de leche en la boca.  Se  desprende solo del pecho. Signos que presenta usted:  Las mamas han aumentado la firmeza, el peso y el tamao 1 a 3 horas despus de amamantar.  Estn ms blandas inmediatamente despus de amamantar.  Un aumento del volumen de leche, y tambin un cambio en su consistencia y color se producen hacia el quinto da de lactancia materna.  Los pezones no duelen, ni estn agrietados ni sangran. Signos de que su beb recibe la cantidad de leche suficiente   Mojar   por lo menos 1 o 2 paales durante las primeras 24 horas despus del nacimiento.  Mojar por lo menos 5 o 6 paales cada 24 horas durante la primera semana despus del nacimiento. La orina debe ser transparente o de color amarillo plido a los 5 das despus del nacimiento.  Mojar entre 6 y 8 paales cada 24 horas a medida que el beb sigue creciendo y desarrollndose.  Defeca al menos 3 veces en 24 horas a los 5 das de vida. La materia fecal debe ser blanda y amarillenta.  Defeca al menos 3 veces en 24 horas a los 7 das de vida. La materia fecal debe ser grumosa y amarillenta.  No registra una prdida de peso mayor del 10% del peso al nacer durante los primeros 3 das de vida.  Aumenta de peso un promedio de 4 a 7onzas (113 a 198g) por semana despus de los 4 das de vida.  Aumenta de peso, diariamente, de manera uniforme a partir de los 5 das de vida, sin registrar prdida de peso despus de las 2semanas de vida. Despus de alimentarse, es posible que el beb regurgite una pequea cantidad. Esto es frecuente. FRECUENCIA Y DURACIN DE LA LACTANCIA MATERNA El amamantamiento frecuente la ayudar a producir ms leche y a prevenir problemas de dolor en los pezones e hinchazn en las mamas. Alimente al beb cuando muestre signos de hambre o si siente la necesidad de reducir la congestin de las mamas. Esto se denomina "lactancia a demanda". Evite el uso del chupete mientras trabaja para establecer la lactancia (las primeras 4 a 6  semanas despus del nacimiento del beb). Despus de este perodo, podr ofrecerle un chupete. Las investigaciones demostraron que el uso del chupete durante el primer ao de vida del beb disminuye el riesgo de desarrollar el sndrome de muerte sbita del lactante (SMSL). Permita que el nio se alimente en cada mama todo lo que desee. Contine amamantando al beb hasta que haya terminado de alimentarse. Cuando el beb se desprende o se queda dormido mientras se est alimentando de la primera mama, ofrzcale la segunda. Debido a que, con frecuencia, los recin nacidos permanecen somnolientos las primeras semanas de vida, es posible que deba despertar al beb para alimentarlo. Los horarios de lactancia varan de un beb a otro. Sin embargo, las siguientes reglas pueden servir como gua para ayudarla a garantizar que el beb se alimenta adecuadamente:  Se puede amamantar a los recin nacidos (bebs de 4 semanas o menos de vida) cada 1 a 3 horas.  No deben transcurrir ms de 3 horas durante el da o 5 horas durante la noche sin que se amamante a los recin nacidos.  Debe amamantar al beb 8 veces como mnimo en un perodo de 24 horas, hasta que comience a introducir slidos en su dieta, a los 6 meses de vida aproximadamente. EXTRACCIN DE LECHE MATERNA La extraccin y el almacenamiento de la leche materna le permiten asegurarse de que el beb se alimente exclusivamente de leche materna, aun en momentos en los que no puede amamantar. Esto tiene especial importancia si debe regresar al trabajo en el perodo en que an est amamantando o si no puede estar presente en los momentos en que el beb debe alimentarse. Su asesor en lactancia puede orientarla sobre cunto tiempo es seguro almacenar leche materna. El sacaleche es un aparato que le permite extraer leche de la mama a un recipiente estril. Luego, la leche materna extrada puede almacenarse en un refrigerador o congelador.   Algunos sacaleches son manuales,  mientras que otros son elctricos. Consulte a su asesor en lactancia qu tipo ser ms conveniente para usted. Los sacaleches se pueden comprar; sin embargo, algunos hospitales y grupos de apoyo a la lactancia materna alquilan sacaleches mensualmente. Un asesor en lactancia puede ensearle cmo extraer leche materna manualmente, en caso de que prefiera no usar un sacaleche. CMO CUIDAR LAS MAMAS DURANTE LA LACTANCIA MATERNA Los pezones se secan, agrietan y duelen durante la lactancia materna. Las siguientes recomendaciones pueden ayudarla a mantener las mamas humectadas y sanas:  Evite usar jabn en los pezones.  Use un sostn de soporte. Aunque no son esenciales, las camisetas sin mangas o los sostenes especiales para amamantar estn diseados para acceder fcilmente a las mamas, para amamantar sin tener que quitarse todo el sostn o la camiseta. Evite usar sostenes con aro o sostenes muy ajustados.  Seque al aire sus pezones durante 3 a 4minutos despus de amamantar al beb.  Utilice solo apsitos de algodn en el sostn para absorber las prdidas de leche. La prdida de un poco de leche materna entre las tomas es normal.  Utilice lanolina sobre los pezones luego de amamantar. La lanolina ayuda a mantener la humedad normal de la piel. Si usa lanolina pura, no tiene que lavarse los pezones antes de volver a alimentar al beb. La lanolina pura no es txica para el beb. Adems, puede extraer manualmente algunas gotas de leche materna y masajear suavemente esa leche sobre los pezones, para que la leche se seque al aire. Durante las primeras semanas despus de dar a luz, algunas mujeres pueden experimentar hinchazn en las mamas (congestin mamaria). La congestin puede hacer que sienta las mamas pesadas, calientes y sensibles al tacto. El pico de la congestin ocurre dentro de los 3 a 5 das despus del parto. Las siguientes recomendaciones pueden ayudarla a aliviar la congestin:  Vace por completo  las mamas al amamantar o extraer leche. Puede aplicar calor hmedo en las mamas (en la ducha o con toallas hmedas para manos) antes de amamantar o extraer leche. Esto aumenta la circulacin y ayuda a que la leche fluya. Si el beb no vaca por completo las mamas cuando lo amamanta, extraiga la leche restante despus de que haya finalizado.  Use un sostn ajustado (para amamantar o comn) o una camiseta sin mangas durante 1 o 2 das para indicar al cuerpo que disminuya ligeramente la produccin de leche.  Aplique compresas de hielo sobre las mamas, a menos que le resulte demasiado incmodo.  Asegrese de que el beb est prendido y se encuentre en la posicin correcta mientras lo alimenta. Si la congestin persiste luego de 48 horas o despus de seguir estas recomendaciones, comunquese con su mdico o un asesor en lactancia. RECOMENDACIONES GENERALES PARA EL CUIDADO DE LA SALUD DURANTE LA LACTANCIA MATERNA  Consuma alimentos saludables. Alterne comidas y colaciones, y coma 3 de cada una por da. Dado que lo que come afecta la leche materna, es posible que algunas comidas hagan que su beb se vuelva ms irritable de lo habitual. Evite comer este tipo de alimentos si percibe que afectan de manera negativa al beb.  Beba leche, jugos de fruta y agua para satisfacer su sed (aproximadamente 10 vasos al da).  Descanse con frecuencia, reljese y tome sus vitaminas prenatales para evitar la fatiga, el estrs y la anemia.  Contine con los autocontroles de la mama.  Evite masticar y fumar tabaco. Las sustancias qumicas de los cigarrillos que pasan   a la leche materna y la exposicin al humo ambiental del tabaco pueden daar al beb.  No consuma alcohol ni drogas, incluida la marihuana. Algunos medicamentos, que pueden ser perjudiciales para el beb, pueden pasar a travs de la leche materna. Es importante que consulte a su mdico antes de tomar cualquier medicamento, incluidos todos los medicamentos  recetados y de venta libre, as como los suplementos vitamnicos y herbales. Puede quedar embarazada durante la lactancia. Si desea controlar la natalidad, consulte a su mdico cules son las opciones ms seguras para el beb. SOLICITE ATENCIN MDICA SI:  Usted siente que quiere dejar de amamantar o se siente frustrada con la lactancia.  Siente dolor en las mamas o en los pezones.  Sus pezones estn agrietados o sangran.  Sus pechos estn irritados, sensibles o calientes.  Tiene un rea hinchada en cualquiera de las mamas.  Siente escalofros o fiebre.  Tiene nuseas o vmitos.  Presenta una secrecin de otro lquido distinto de la leche materna de los pezones.  Sus mamas no se llenan antes de amamantar al beb para el quinto da despus del parto.  Se siente triste y deprimida.  El beb est demasiado somnoliento como para comer bien.  El beb tiene problemas para dormir.  Moja menos de 3 paales en 24 horas.  Defeca menos de 3 veces en 24 horas.  La piel del beb o la parte blanca de los ojos se vuelven amarillentas.  El beb no ha aumentado de peso a los 5 das de vida. SOLICITE ATENCIN MDICA DE INMEDIATO SI:  El beb est muy cansado (letargo) y no se quiere despertar para comer.  Le sube la fiebre sin causa. Esta informacin no tiene como fin reemplazar el consejo del mdico. Asegrese de hacerle al mdico cualquier pregunta que tenga. Document Released: 11/04/2005 Document Revised: 02/26/2016 Document Reviewed: 04/28/2013 Elsevier Interactive Patient Education  2017 [employer]  

## 2017-05-29 NOTE — Progress Notes (Signed)
Spanish interpreter: [name] used   PRENATAL VISIT NOTE  Subjective:  [name] is a 31 y.o. [phone] at 6963w0d being seen today for ongoing prenatal care.  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  Patient reports vaginal irritation.  Contractions: Not present. Vag. Bleeding: None.  Movement: Absent. Denies leaking of fluid.   The following portions of the patient's history were reviewed and updated as appropriate: allergies, current medications, past family history, past medical history, past social history, past surgical history and problem list. Problem list updated.  Objective:   Vitals:   05/29/17 1424  BP: 121/68  Pulse: 74  Weight: 167 lb (75.8 kg)    Fetal Status: Fetal Heart Rate (bpm): 154 Fundal Height: 18 cm Movement: Absent     General:  Alert, oriented and cooperative. Patient is in no acute distress.  Skin: Skin is warm and dry. No rash noted.   Cardiovascular: Normal heart rate noted  Respiratory: Normal respiratory effort, no problems with respiration noted  Abdomen: Soft, gravid, appropriate for gestational age. Pain/Pressure: Absent     Pelvic:  Cervical exam deferred        Extremities: Normal range of motion.  Edema: None  Mental Status: Normal mood and affect. Normal behavior. Normal judgment and thought content.   Assessment and Plan:  Pregnancy: [id number] at 7363w0d  1. 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 Supervision of high risk pregnancy, antepartum, second trimester - AFP TETRA  3. History of preterm delivery Iatrogenic due to pre-eclampsia or eclampsia  4. Vaginal  irritation Sounds like yeast - terconazole (TERAZOL 3) 0.8 % vaginal cream; Place 1 applicator vaginally at bedtime.  Dispense: 20 g; Refill: 0  General obstetric precautions including but not limited to vaginal bleeding, contractions, leaking of fluid and fetal movement were reviewed in detail with the patient. Please refer to After Visit Summary for other counseling recommendations.  Return in 4 weeks (on 06/26/2017).   Reva Boresanya S Johnnie Moten, MD

## 2017-06-09 LAB — AFP TETRA
DIA MOM VALUE: 0.88
DIA Value (EIA): 160.94 pg/mL
DSR (BY AGE) 1 IN: 460
DSR (Second Trimester) 1 IN: 2900
Gestational Age: 20 WEEKS
MATERNAL AGE AT EDD: 32.8 a
MSAFP MOM: 0.93
MSAFP: 48.5 ng/mL
MSHCG MOM: 0.95
MSHCG: 20917 m[IU]/mL
Osb Risk: 10000
T18 (By Age): 1:1793 {titer}
TEST RESULTS AFP: NEGATIVE
UE3 VALUE: 1.58 ng/mL
Weight: 167 [lb_av]
uE3 Mom: 0.91

## 2017-06-27 ENCOUNTER — Ambulatory Visit (INDEPENDENT_AMBULATORY_CARE_PROVIDER_SITE_OTHER): Payer: Self-pay | Admitting: Family Medicine

## 2017-06-27 VITALS — BP 110/67 | HR 68 | Wt 165.1 lb

## 2017-06-27 DIAGNOSIS — O09212 Supervision of pregnancy with history of pre-term labor, second trimester: Secondary | ICD-10-CM

## 2017-06-27 DIAGNOSIS — Z8751 Personal history of pre-term labor: Secondary | ICD-10-CM

## 2017-06-27 DIAGNOSIS — Z87898 Personal history of other specified conditions: Secondary | ICD-10-CM

## 2017-06-27 DIAGNOSIS — Z789 Other specified health status: Secondary | ICD-10-CM

## 2017-06-27 DIAGNOSIS — O0992 Supervision of high risk pregnancy, unspecified, second trimester: Secondary | ICD-10-CM

## 2017-06-27 NOTE — Progress Notes (Signed)
   PRENATAL VISIT NOTE  Subjective:  [name] is a 31 y.o. [phone] at 3080w1d being seen today for ongoing prenatal care.  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  Patient reports no complaints.  Contractions: Not present.  .  Movement: Present. Denies leaking of fluid.   The following portions of the patient's history were reviewed and updated as appropriate: allergies, current medications, past family history, past medical history, past social history, past surgical history and problem list. Problem list updated.  Objective:   Vitals:   06/27/17 1101  BP: 110/67  Pulse: 68  Weight: 165 lb 1.6 oz (74.9 kg)    Fetal Status: Fetal Heart Rate (bpm): 165   Movement: Present     General:  Alert, oriented and cooperative. Patient is in no acute distress.  Skin: Skin is warm and dry. No rash noted.   Cardiovascular: Normal heart rate noted  Respiratory: Normal respiratory effort, no problems with respiration noted  Abdomen: Soft, gravid, appropriate for gestational age.  Pain/Pressure: Absent     Pelvic: Cervical exam deferred        Extremities: Normal range of motion.  Edema: None  Mental Status:  Normal mood and affect. Normal behavior. Normal judgment and thought content.   Assessment and Plan:  Pregnancy: [id number] at 5180w1d  1. Supervision of high risk pregnancy, antepartum, second trimester FHT and FH normal. Quad screen normal. Anatomy scan normal.  2. History of preterm delivery No evidence of preterm labor.  3. Language barrier Interpreter used.  4. History of low birth weight FH normal  Preterm labor symptoms and general obstetric precautions including  but not limited to vaginal bleeding, contractions, leaking of fluid and fetal movement were reviewed in detail with the patient. Please refer to After Visit Summary for other counseling recommendations.  Return in about 4 weeks (around 07/25/2017) for HR OB f/u, 2 hr GTT.   Levie HeritageJacob J Veralyn Lopp, DO

## 2017-07-24 ENCOUNTER — Ambulatory Visit (INDEPENDENT_AMBULATORY_CARE_PROVIDER_SITE_OTHER): Payer: Self-pay | Admitting: Obstetrics and Gynecology

## 2017-07-24 VITALS — BP 108/71 | HR 78 | Wt 167.4 lb

## 2017-07-24 DIAGNOSIS — Z23 Encounter for immunization: Secondary | ICD-10-CM

## 2017-07-24 DIAGNOSIS — Z8751 Personal history of pre-term labor: Secondary | ICD-10-CM

## 2017-07-24 DIAGNOSIS — O0992 Supervision of high risk pregnancy, unspecified, second trimester: Secondary | ICD-10-CM

## 2017-07-24 DIAGNOSIS — O2203 Varicose veins of lower extremity in pregnancy, third trimester: Secondary | ICD-10-CM

## 2017-07-24 DIAGNOSIS — O0993 Supervision of high risk pregnancy, unspecified, third trimester: Secondary | ICD-10-CM

## 2017-07-24 DIAGNOSIS — Z8759 Personal history of other complications of pregnancy, childbirth and the puerperium: Secondary | ICD-10-CM

## 2017-07-24 DIAGNOSIS — O0990 Supervision of high risk pregnancy, unspecified, unspecified trimester: Secondary | ICD-10-CM

## 2017-07-24 NOTE — Patient Instructions (Signed)
Venas varicosas  (Varicose Veins)  Las venas varicosas son venas que se han agrandado y tornado sinuosas. Suelen aparecer en las piernas, pero también pueden verse en otra parte del cuerpo.  CAUSAS  Esta afección se presenta como consecuencia del mal funcionamiento de las válvulas de las venas, las cuales ayudan al retorno de la sangre desde las piernas hacia el corazón. Si estas válvulas se dañan, la sangre retrocede y regresa a las venas de la pierna, cerca de la superficie de la piel, lo que causa dilatación venosa.  FACTORES DE RIESGO  Las personas que están mucho tiempo paradas, las embarazadas o las personas con sobrepeso tienen más probabilidades de tener venas varicosas.  SIGNOS Y SÍNTOMAS  · Venas abultadas, azuladas y de aspecto sinuoso que se observan con mayor frecuencia en las piernas.  · Dolor o sensación de pesadez en las piernas. Estos síntomas pueden empeorar al final del día.  · Hinchazón de las piernas.  · Cambios en el color de la piel.    DIAGNÓSTICO  Generalmente, el médico puede diagnosticar las venas varicosas al examinarle las piernas. Además, puede recomendarle que se haga una ecografía de las venas de las piernas.  TRATAMIENTO  La mayoría de las venas varicosas pueden tratarse en casa. Sin embargo, hay otros tratamientos a disposición de las personas que tienen síntomas persistentes o desean mejorar la apariencia estética de las venas varicosas. Estas opciones de tratamiento incluyen lo siguiente:  · Escleroterapia. Se inyecta una solución en la vena para anularla.  · Tratamiento con láser. Se usa un rayo láser para calentar la vena y anularla.  · Ablación venosa por radiofrecuencia Se usa una corriente eléctrica que se produce mediante ondas de radio para anular la vena.  · Flebectomía. Se extirpa quirúrgicamente la vena a través de pequeñas incisiones que se hacen sobre la vena varicosa.  · Ligadura venosa y varicectomía. La vena se extirpa quirúrgicamente a través de incisiones que se  realizan sobre la vena varicosa después de haberla anudado (ligado).  INSTRUCCIONES PARA EL CUIDADO EN EL HOGAR  · No permanezca sentado o de pie en una posición durante mucho tiempo. No se siente con las piernas cruzadas. Descanse con las piernas elevadas durante el día.  · Use medias de compresión como le haya indicado su médico. Estas medias ayudan a evitar la formación de coágulos sanguíneos y a reducir la hinchazón de las piernas.  · No use otras prendas que le ajusten todo el contorno de las piernas, la pelvis o la cintura.  · Camine todo lo posible para aumentar la circulación de la sangre.  · A la noche, eleve el pie de la cama con bloques de 2 pulgadas.  · Si tiene un corte en la piel sobre la vena y la vena sangra, recuéstese con la pierna elevada y ejerza presión en el lugar con un paño limpio, hasta que deje de sangrar. Luego aplique un apósito (vendaje) sobre el corte. Consulte al médico si el sangrado continúa.    SOLICITE ATENCIÓN MÉDICA SI:  · La piel alrededor del tobillo empieza a agrietarse.  · Siente dolor, hay enrojecimiento, sensibilidad o hinchazón dura en la pierna sobre una vena.  · Está incómodo debido al dolor de la pierna.    Esta información no tiene como fin reemplazar el consejo del médico. Asegúrese de hacerle al médico cualquier pregunta que tenga.  Document Released: 08/14/2005 Document Revised: 02/26/2016 Document Reviewed: 05/07/2016  Elsevier Interactive Patient Education © 2017 [employer]

## 2017-07-24 NOTE — Progress Notes (Signed)
Subjective:  [name] is a 31 y.o. [phone] at 4530w0d being seen today for ongoing prenatal care.  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  Patient reports increased swelling of veins. She has known varices in her legs and is starting to have some in her pelvis. No pain.  Contractions: Not present. Vag. Bleeding: None.  Movement: Present. Denies leaking of fluid.   The following portions of the patient's history were reviewed and updated as appropriate: allergies, current medications, past family history, past medical history, past social history, past surgical history and problem list. Problem list updated.  Objective:   Vitals:   07/24/17 0749  BP: 108/71  Pulse: 78  Weight: 167 lb 6.4 oz (75.9 kg)    Fetal Status: Fetal Heart Rate (bpm): 152 Fundal Height: 28 cm Movement: Present     General:  Alert, oriented and cooperative. Patient is in no acute distress.  Skin: Skin is warm and dry. Bilateral legs with prominent varicose veins bilaterally.  Cardiovascular: Normal heart rate noted  Respiratory: Normal respiratory effort, no problems with respiration noted  Abdomen: Soft, gravid, appropriate for gestational age. Pain/Pressure: Present     Pelvic: Vag. Bleeding: None    . No pelvic varices visualized.  Cervical exam deferred        Extremities: Normal range of motion.  Edema: Trace  Mental Status: Normal mood and affect. Normal behavior. Normal judgment and thought content.   Urinalysis:      Assessment and Plan:  Pregnancy: [id number] at 10330w0d  1. Supervision of high risk pregnancy, antepartum Continue routine prenatal care. Getting 28wk labs today. Will contact  patient about results. Tdap given.  - Glucose Tolerance, 2 Hours w/1 Hour - RPR - CBC - HIV antibody - Tdap vaccine greater than or equal to 7yo IM  2. History of eclampsia BP wnl today. Continue ASA.   3. History of preterm delivery Iatrogenic x2 due to hypertensive disorders.   4. Varicose veins of lower extremity during pregnancy in third trimester Worsening in setting of growing uterus. Patient counseled. No pain at this time. Continue to monitor. Consider dermatology referral postpartum. Encouraged compression stockings.   Preterm labor symptoms and general obstetric precautions including but not limited to vaginal bleeding, contractions, leaking of fluid and fetal movement were reviewed in detail with the patient. Please refer to After Visit Summary for other counseling recommendations.  Return in about 2 weeks (around 08/07/2017) for ob visit.   Caryl AdaJazma Phelps, DO [facility], [facility] [place] 07/24/2017, 8:36 AM

## 2017-07-25 LAB — CBC
Hematocrit: 31.6 % — ABNORMAL LOW (ref 34.0–46.6)
Hemoglobin: 10.6 g/dL — ABNORMAL LOW (ref 11.1–15.9)
MCH: 32.3 pg (ref 26.6–33.0)
MCHC: 33.5 g/dL (ref 31.5–35.7)
MCV: 96 fL (ref 79–97)
Platelets: 266 10*3/uL (ref 150–379)
RBC: 3.28 x10E6/uL — ABNORMAL LOW (ref 3.77–5.28)
RDW: 14.2 % (ref 12.3–15.4)
WBC: 5.9 10*3/uL (ref 3.4–10.8)

## 2017-07-25 LAB — RPR: RPR Ser Ql: NONREACTIVE

## 2017-07-25 LAB — GLUCOSE TOLERANCE, 2 HOURS W/ 1HR
GLUCOSE, 1 HOUR: 127 mg/dL (ref 65–179)
GLUCOSE, FASTING: 83 mg/dL (ref 65–91)
Glucose, 2 hour: 81 mg/dL (ref 65–152)

## 2017-07-25 LAB — HIV ANTIBODY (ROUTINE TESTING W REFLEX): HIV SCREEN 4TH GENERATION: NONREACTIVE

## 2017-08-11 ENCOUNTER — Ambulatory Visit (INDEPENDENT_AMBULATORY_CARE_PROVIDER_SITE_OTHER): Payer: Self-pay | Admitting: Obstetrics & Gynecology

## 2017-08-11 VITALS — BP 118/71 | HR 67 | Wt 167.1 lb

## 2017-08-11 DIAGNOSIS — O09212 Supervision of pregnancy with history of pre-term labor, second trimester: Secondary | ICD-10-CM

## 2017-08-11 DIAGNOSIS — O0992 Supervision of high risk pregnancy, unspecified, second trimester: Secondary | ICD-10-CM

## 2017-08-11 DIAGNOSIS — Z8751 Personal history of pre-term labor: Secondary | ICD-10-CM

## 2017-08-11 DIAGNOSIS — Z87898 Personal history of other specified conditions: Secondary | ICD-10-CM

## 2017-08-11 NOTE — Progress Notes (Signed)
   PRENATAL VISIT NOTE  Subjective:  [name] is a 31 y.o. [phone] at [redacted]w[redacted]d being seen today for ongoing prenatal care.  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  Patient reports varicose Vs.  Contractions: Not present. Vag. Bleeding: None.  Movement: Present. Denies leaking of fluid.   The following portions of the patient's history were reviewed and updated as appropriate: allergies, current medications, past family history, past medical history, past social history, past surgical history and problem list. Problem list updated.  Objective:   Vitals:   08/11/17 1442  BP: 118/71  Pulse: 67  Weight: 75.8 kg (167 lb 1.6 oz)    Fetal Status: Fetal Heart Rate (bpm): 142 Fundal Height: 30 cm Movement: Present     General:  Alert, oriented and cooperative. Patient is in no acute distress.  Skin: Skin is warm and dry. No rash noted.   Cardiovascular: Normal heart rate noted  Respiratory: Normal respiratory effort, no problems with respiration noted  Abdomen: Soft, gravid, appropriate for gestational age.  Pain/Pressure: Absent     Pelvic: Cervical exam deferred        Extremities: Normal range of motion.  Edema: Mild pitting, slight indentation  Mental Status:  Normal mood and affect. Normal behavior. Normal judgment and thought content.   Assessment and Plan:  Pregnancy: [id number] at [redacted]w[redacted]d  1. Supervision of high risk pregnancy, antepartum, second trimester Needs f/u for growth - [place] MFM OB FOLLOW UP; Future  2. History of preterm delivery  - [place] [facility] OB FOLLOW UP; Future  3. History of low birth weight   Preterm labor symptoms and general obstetric precautions  including but not limited to vaginal bleeding, contractions, leaking of fluid and fetal movement were reviewed in detail with the patient. Please refer to After Visit Summary for other counseling recommendations.  Return in about 2 weeks (around 08/25/2017). Use support hose for LE varicose vs.  Scheryl Darter, MD

## 2017-08-11 NOTE — Patient Instructions (Signed)
Third Trimester of Pregnancy The third trimester is from week 28 through week 40 (months 7 through 9). The third trimester is a time when the unborn baby (fetus) is growing rapidly. At the end of the ninth month, the fetus is about 20 inches in length and weighs 6-10 pounds. Body changes during your third trimester Your body will continue to go through many changes during pregnancy. The changes vary from woman to woman. During the third trimester:  Your weight will continue to increase. You can expect to gain 25-35 pounds (11-16 kg) by the end of the pregnancy.  You may begin to get stretch marks on your hips, abdomen, and breasts.  You may urinate more often because the fetus is moving lower into your pelvis and pressing on your bladder.  You may develop or continue to have heartburn. This is caused by increased hormones that slow down muscles in the digestive tract.  You may develop or continue to have constipation because increased hormones slow digestion and cause the muscles that push waste through your intestines to relax.  You may develop hemorrhoids. These are swollen veins (varicose veins) in the rectum that can itch or be painful.  You may develop swollen, bulging veins (varicose veins) in your legs.  You may have increased body aches in the pelvis, back, or thighs. This is due to weight gain and increased hormones that are relaxing your joints.  You may have changes in your hair. These can include thickening of your hair, rapid growth, and changes in texture. Some women also have hair loss during or after pregnancy, or hair that feels dry or thin. Your hair will most likely return to normal after your baby is born.  Your breasts will continue to grow and they will continue to become tender. A yellow fluid (colostrum) may leak from your breasts. This is the first milk you are producing for your baby.  Your belly button may stick out.  You may notice more swelling in your hands,  face, or ankles.  You may have increased tingling or numbness in your hands, arms, and legs. The skin on your belly may also feel numb.  You may feel short of breath because of your expanding uterus.  You may have more problems sleeping. This can be caused by the size of your belly, increased need to urinate, and an increase in your body's metabolism.  You may notice the fetus "dropping," or moving lower in your abdomen (lightening).  You may have increased vaginal discharge.  You may notice your joints feel loose and you may have pain around your pelvic bone.  What to expect at prenatal visits You will have prenatal exams every 2 weeks until week 36. Then you will have weekly prenatal exams. During a routine prenatal visit:  You will be weighed to make sure you and the baby are growing normally.  Your blood pressure will be taken.  Your abdomen will be measured to track your baby's growth.  The fetal heartbeat will be listened to.  Any test results from the previous visit will be discussed.  You may have a cervical check near your due date to see if your cervix has softened or thinned (effaced).  You will be tested for Group B streptococcus. This happens between 35 and 37 weeks.  Your health care provider may ask you:  What your birth plan is.  How you are feeling.  If you are feeling the baby move.  If you have had   any abnormal symptoms, such as leaking fluid, bleeding, severe headaches, or abdominal cramping.  If you are using any tobacco products, including cigarettes, chewing tobacco, and electronic cigarettes.  If you have any questions.  Other tests or screenings that may be performed during your third trimester include:  Blood tests that check for low iron levels (anemia).  Fetal testing to check the health, activity level, and growth of the fetus. Testing is done if you have certain medical conditions or if there are problems during the  pregnancy.  Nonstress test (NST). This test checks the health of your baby to make sure there are no signs of problems, such as the baby not getting enough oxygen. During this test, a belt is placed around your belly. The baby is made to move, and its heart rate is monitored during movement.  What is false labor? False labor is a condition in which you feel small, irregular tightenings of the muscles in the womb (contractions) that usually go away with rest, changing position, or drinking water. These are called Braxton Hicks contractions. Contractions may last for hours, days, or even weeks before true labor sets in. If contractions come at regular intervals, become more frequent, increase in intensity, or become painful, you should see your health care provider. What are the signs of labor?  Abdominal cramps.  Regular contractions that start at 10 minutes apart and become stronger and more frequent with time.  Contractions that start on the top of the uterus and spread down to the lower abdomen and back.  Increased pelvic pressure and dull back pain.  A watery or bloody mucus discharge that comes from the vagina.  Leaking of amniotic fluid. This is also known as your "water breaking." It could be a slow trickle or a gush. Let your health care provider know if it has a color or strange odor. If you have any of these signs, call your health care provider right away, even if it is before your due date. Follow these instructions at home: Medicines  Follow your health care provider's instructions regarding medicine use. Specific medicines may be either safe or unsafe to take during pregnancy.  Take a prenatal vitamin that contains at least 600 micrograms (mcg) of folic acid.  If you develop constipation, try taking a stool softener if your health care provider approves. Eating and drinking  Eat a balanced diet that includes fresh fruits and vegetables, whole grains, good sources of protein  such as meat, eggs, or tofu, and low-fat dairy. Your health care provider will help you determine the amount of weight gain that is right for you.  Avoid raw meat and uncooked cheese. These carry germs that can cause birth defects in the baby.  If you have low calcium intake from food, talk to your health care provider about whether you should take a daily calcium supplement.  Eat four or five small meals rather than three large meals a day.  Limit foods that are high in fat and processed sugars, such as fried and sweet foods.  To prevent constipation: ? Drink enough fluid to keep your urine clear or pale yellow. ? Eat foods that are high in fiber, such as fresh fruits and vegetables, whole grains, and beans. Activity  Exercise only as directed by your health care provider. Most women can continue their usual exercise routine during pregnancy. Try to exercise for 30 minutes at least 5 days a week. Stop exercising if you experience uterine contractions.  Avoid heavy   lifting.  Do not exercise in extreme heat or humidity, or at high altitudes.  Wear low-heel, comfortable shoes.  Practice good posture.  You may continue to have sex unless your health care provider tells you otherwise. Relieving pain and discomfort  Take frequent breaks and rest with your legs elevated if you have leg cramps or low back pain.  Take warm sitz baths to soothe any pain or discomfort caused by hemorrhoids. Use hemorrhoid cream if your health care provider approves.  Wear a good support bra to prevent discomfort from breast tenderness.  If you develop varicose veins: ? Wear support pantyhose or compression stockings as told by your healthcare provider. ? Elevate your feet for 15 minutes, 3-4 times a day. Prenatal care  Write down your questions. Take them to your prenatal visits.  Keep all your prenatal visits as told by your health care provider. This is important. Safety  Wear your seat belt at  all times when driving.  Make a list of emergency phone numbers, including numbers for family, friends, the hospital, and police and fire departments. General instructions  Avoid cat litter boxes and soil used by cats. These carry germs that can cause birth defects in the baby. If you have a cat, ask someone to clean the litter box for you.  Do not travel far distances unless it is absolutely necessary and only with the approval of your health care provider.  Do not use hot tubs, steam rooms, or saunas.  Do not drink alcohol.  Do not use any products that contain nicotine or tobacco, such as cigarettes and e-cigarettes. If you need help quitting, ask your health care provider.  Do not use any medicinal herbs or unprescribed drugs. These chemicals affect the formation and growth of the baby.  Do not douche or use tampons or scented sanitary pads.  Do not cross your legs for long periods of time.  To prepare for the arrival of your baby: ? Take prenatal classes to understand, practice, and ask questions about labor and delivery. ? Make a trial run to the hospital. ? Visit the hospital and tour the maternity area. ? Arrange for maternity or paternity leave through employers. ? Arrange for family and friends to take care of pets while you are in the hospital. ? Purchase a rear-facing car seat and make sure you know how to install it in your car. ? Pack your hospital bag. ? Prepare the baby's nursery. Make sure to remove all pillows and stuffed animals from the baby's crib to prevent suffocation.  Visit your dentist if you have not gone during your pregnancy. Use a soft toothbrush to brush your teeth and be gentle when you floss. Contact a health care provider if:  You are unsure if you are in labor or if your water has broken.  You become dizzy.  You have mild pelvic cramps, pelvic pressure, or nagging pain in your abdominal area.  You have lower back pain.  You have persistent  nausea, vomiting, or diarrhea.  You have an unusual or bad smelling vaginal discharge.  You have pain when you urinate. Get help right away if:  Your water breaks before 37 weeks.  You have regular contractions less than 5 minutes apart before 37 weeks.  You have a fever.  You are leaking fluid from your vagina.  You have spotting or bleeding from your vagina.  You have severe abdominal pain or cramping.  You have rapid weight loss or weight gain.    You have shortness of breath with chest pain.  You notice sudden or extreme swelling of your face, hands, ankles, feet, or legs.  Your baby makes fewer than 10 movements in 2 hours.  You have severe headaches that do not go away when you take medicine.  You have vision changes. Summary  The third trimester is from week 28 through week 40, months 7 through 9. The third trimester is a time when the unborn baby (fetus) is growing rapidly.  During the third trimester, your discomfort may increase as you and your baby continue to gain weight. You may have abdominal, leg, and back pain, sleeping problems, and an increased need to urinate.  During the third trimester your breasts will keep growing and they will continue to become tender. A yellow fluid (colostrum) may leak from your breasts. This is the first milk you are producing for your baby.  False labor is a condition in which you feel small, irregular tightenings of the muscles in the womb (contractions) that eventually go away. These are called Braxton Hicks contractions. Contractions may last for hours, days, or even weeks before true labor sets in.  Signs of labor can include: abdominal cramps; regular contractions that start at 10 minutes apart and become stronger and more frequent with time; watery or bloody mucus discharge that comes from the vagina; increased pelvic pressure and dull back pain; and leaking of amniotic fluid. This information is not intended to replace advice  given to you by your health care provider. Make sure you discuss any questions you have with your health care provider. Document Released: 10/29/2001 Document Revised: 04/11/2016 Document Reviewed: 01/05/2013 [employer] Interactive Patient Education  2017 [employer]  

## 2017-08-11 NOTE — Progress Notes (Signed)
C/o edema in feet, c/o pain sometimes in her varicose veins. C/o still some white vaginal discharge and vaginal itcing, states still has some of the terazole cream- instructed her she may continue to use it. States she was told to use it for 3 nights.

## 2017-08-13 ENCOUNTER — Ambulatory Visit (HOSPITAL_COMMUNITY)
Admission: RE | Admit: 2017-08-13 | Discharge: 2017-08-13 | Disposition: A | Discharge status: Home / Self Care | Payer: Self-pay | Source: Ambulatory Visit | Attending: Obstetrics & Gynecology | Admitting: Obstetrics & Gynecology

## 2017-08-13 ENCOUNTER — Other Ambulatory Visit: Payer: Self-pay | Admitting: Obstetrics & Gynecology

## 2017-08-13 DIAGNOSIS — O0992 Supervision of high risk pregnancy, unspecified, second trimester: Secondary | ICD-10-CM

## 2017-08-13 DIAGNOSIS — O09213 Supervision of pregnancy with history of pre-term labor, third trimester: Secondary | ICD-10-CM | POA: Insufficient documentation

## 2017-08-13 DIAGNOSIS — O09293 Supervision of pregnancy with other poor reproductive or obstetric history, third trimester: Secondary | ICD-10-CM | POA: Insufficient documentation

## 2017-08-13 DIAGNOSIS — Z3A30 30 weeks gestation of pregnancy: Secondary | ICD-10-CM

## 2017-08-13 DIAGNOSIS — Z8751 Personal history of pre-term labor: Secondary | ICD-10-CM

## 2017-08-28 ENCOUNTER — Ambulatory Visit (INDEPENDENT_AMBULATORY_CARE_PROVIDER_SITE_OTHER): Payer: Self-pay | Admitting: Obstetrics and Gynecology

## 2017-08-28 VITALS — BP 123/72 | HR 67 | Wt 172.5 lb

## 2017-08-28 DIAGNOSIS — O0992 Supervision of high risk pregnancy, unspecified, second trimester: Secondary | ICD-10-CM

## 2017-08-28 DIAGNOSIS — Z87898 Personal history of other specified conditions: Secondary | ICD-10-CM

## 2017-08-28 DIAGNOSIS — Z8751 Personal history of pre-term labor: Secondary | ICD-10-CM

## 2017-08-28 DIAGNOSIS — Z8759 Personal history of other complications of pregnancy, childbirth and the puerperium: Secondary | ICD-10-CM

## 2017-08-28 DIAGNOSIS — Z23 Encounter for immunization: Secondary | ICD-10-CM

## 2017-08-28 DIAGNOSIS — N898 Other specified noninflammatory disorders of vagina: Secondary | ICD-10-CM

## 2017-08-28 DIAGNOSIS — O2202 Varicose veins of lower extremity in pregnancy, second trimester: Secondary | ICD-10-CM

## 2017-08-28 NOTE — Progress Notes (Signed)
   PRENATAL VISIT NOTE  Subjective:  [name] is a 31 y.o. [phone] at [redacted]w[redacted]d being seen today for ongoing prenatal care.  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  Patient reports generalyl feeling well but with more swelling in feet lately.  Contractions: Not present. Vag. Bleeding: None.  Movement: Present. Denies leaking of fluid.   The following portions of the patient's history were reviewed and updated as appropriate: allergies, current medications, past family history, past medical history, past social history, past surgical history and problem list. Problem list updated.  Objective:   Vitals:   08/28/17 1542  BP: 123/72  Pulse: 67  Weight: 172 lb 8 oz (78.2 kg)    Fetal Status: Fetal Heart Rate (bpm): 152 Fundal Height: 33 cm Movement: Present     General:  Alert, oriented and cooperative. Patient is in no acute distress.  Skin: Skin is warm and dry. No rash noted.   Cardiovascular: Normal heart rate noted  Respiratory: Normal respiratory effort, no problems with respiration noted  Abdomen: Soft, gravid, appropriate for gestational age.  Pain/Pressure: Absent     Pelvic: Cervical exam deferred        Extremities: Normal range of motion.  Edema: Mild pitting, slight indentation  Mental Status:  Normal mood and affect. Normal behavior. Normal judgment and thought content.   Assessment and Plan:  Pregnancy: [id number] at [redacted]w[redacted]d  1. History of eclampsia - BP normal today - reviewed precautions  2. History of low birth weight Last [place] 37thile  3. Supervision of high risk pregnancy, antepartum, second trimester - Flu Vaccine QUAD 36+ mos IM  4. History of  preterm delivery  5. Varicose veins of lower extremity during pregnancy in second trimester rec rest   Preterm labor symptoms and general obstetric precautions including but not limited to vaginal bleeding, contractions, leaking of fluid and fetal movement were reviewed in detail with the patient. Please refer to After Visit Summary for other counseling recommendations.  Return in about 1 week (around 09/04/2017) for OB visit (MD).   Conan Bowens, MD

## 2017-08-28 NOTE — Patient Instructions (Signed)
Eleccin del mtodo anticonceptivo (Contraception Choices) La anticoncepcin (control de la natalidad) es el uso de cualquier mtodo o dispositivo para evitar el embarazo. A continuacin se indican algunos de esos mtodos. MTODOS HORMONALES  El Implante contraconceptivo consiste en un tubo plstico delgado que contiene la hormona progesterona. No contiene estrgenos. El mdico inserta el tubo en la parte interna del brazo. El tubo puede permanecer en el lugar durante 3 aos. Despus de los 3 aos debe retirarse. El implante impide que los ovarios liberen vulos (ovulacin), espesa el moco cervical, lo que evita que los espermatozoides ingresen al tero y hace ms delgada la membrana que cubre el interior del tero.  Inyecciones de progesterona sola: las administra el mdico cada 3 meses para evitar el embarazo. La progesterona sinttica impide que los ovarios liberen vulos. Tambin hacen que el moco cervical se espese y modifique el tejido de recubrimiento interno del tero. Esto hace ms difcil que los espermatozoides sobrevivan en el tero.  Las pldoras anticonceptivas contienen estrgenos y progesterona. Su funcin es evitar que los ovarios liberen vulos (ovulacin). Las hormonas de los anticonceptivos orales hacen que el moco cervical se haga ms espeso, lo que evita que el esperma ingrese al tero. Las pldoras anticonceptivas son recetadas por el mdico.Tambin se utilizan para tratar los perodos menstruales abundantes.  Minipldora: este tipo de pldora anticonceptiva contiene slo hormona progesterona. Deben tomarse todos los das del mes y debe recetarlas el mdico.  El parche de control de natalidad: contiene hormonas similares a las que contienen las pldoras anticonceptivas. Deben cambiarse una vez por semana y se utilizan bajo prescripcin mdica.  Anillo vaginal: contiene hormonas similares a las que contienen las pldoras anticonceptivas. Se deja colocado durante tres semanas, se  lo retira durante 1 semana y luego se coloca uno nuevo. La paciente debe sentirse cmoda al insertar y retirar el anillo de la vagina.Es necesaria la prescripcin mdica.  Anticonceptivos de emergencia: son mtodos para evitar un embarazo despus de una relacin sexual sin proteccin. Esta pldora puede tomarse inmediatamente despus de tener relaciones sexuales o hasta 5 das de haber tenido sexo sin proteccin. Es ms efectiva si se toma poco tiempo despus de la relacin sexual. Los anticonceptivos de emergencia estn disponibles sin prescripcin mdica. Consltelo con su farmacutico. No use los anticonceptivos de emergencia como nico mtodo anticonceptivo.  MTODOS DE BARRERA  Condn masculino: es una vaina delgada (ltex o goma) que se coloca cubriendo al pene durante el acto sexual. Puede usarse con espermicida para aumentar la efectividad.  Condn femenino. Es una funda delicada y blanda que se adapta holgadamente a la vagina antes de las relaciones sexuales.  Diafragma: es una barrera de ltex redonda y suave que debe ser recomendado por un profesional. Se inserta en la vagina, junto con un gel espermicida. Debe insertarse antes de tener relaciones sexuales. Debe dejar el diafragma colocado en la vagina durante 6 a 8 horas despus de la relacin sexual.  Capuchn cervical: es una barrera de ltex o taza plstica redonda y suave que cubre el cuello del tero y debe ser colocada por un mdico. Puede dejarlo colocado en la vagina hasta 48 horas despus de las relaciones sexuales.  Esponja: es una pieza blanda y circular de espuma de poliuretano. Contiene un espermicida. Se inserta en la vagina despus de mojarla y antes de las relaciones sexuales.  Espermicidas: son sustancias qumicas que matan o bloquean al esperma y no lo dejan ingresar al cuello del tero y al tero. Vienen en   forma de cremas, geles, supositorios, espuma o comprimidos. No es necesario tener Emergency planning/management officer. Se insertan en  la vagina con un aplicador antes de Management consultant. El proceso debe repetirse cada vez que tiene relaciones sexuales.  ANTICONCEPTIVOS INTRAUTERINOS  Dispositivo intrauterino (DIU) es un dispositivo en forma de T que se coloca en el tero durante el perodo menstrual, para Location manager. Hay dos tipos: ? DIU de cobre: este tipo de DIU est recubierto con un alambre de cobre y se inserta dentro del tero. El cobre hace que el tero y las trompas de Falopio produzcan un liquido que [employer] espermatozoides. Puede permanecer colocado durante 10 aos. ? DIU con hormona: este tipo de DIU contiene la hormona progestina (progesterona sinttica). La hormona espesa el moco cervical y evita que los espermatozoides ingresen al tero y tambin afina la membrana que cubre el tero para evitar la implantacin del vulo fertilizado. La hormona debilita o destruye los espermatozoides que ingresan al tero. Puede Geneticist, molecular durante 3-5 aos, segn el tipo de DIU que se [place].  MTODOS ANTICONCEPTIVOS PERMANENTES  Ligadura de trompas en la mujer: se realiza sellando, atando u obstruyendo quirrgicamente las trompas de Falopio lo que impide que el vulo descienda hacia el tero.  Esterilizacin histeroscpica: Implica la colocacin de un pequeo espiral o la insercin en cada trompa de Falopio. El mdico utiliza una tcnica llamada histeroscopa para Primary school teacher procedimiento. El dispositivo produce la formacin de tejido Designer, television/film set. Esto da como resultado una obstruccin permanente de las trompas de Falopio, de modo que la esperma no pueda fertilizar el vulo. Demora alrededor de 3 meses despus del procedimiento hasta que el conducto se obstruye. Tendr que usar otro mtodo anticonceptivo durante al menos 3 meses.  Esterilizacin masculina: se realiza ligando los conductos por los que pasan los espermatozoides (vasectoma).Esto impide que el esperma ingrese a la vagina durante el  acto sexual. Luego del procedimiento, el hombre puede eyacular lquido (semen).  MTODOS DE PLANIFICACIN NATURAL  Planificacin familiar natural: consiste en no Management consultant o usar un mtodo de barrera (condn, [place], capuchn cervical) en los [employer] la mujer podra quedar [place].  Mtodo de calendario: consiste en el seguimiento de la duracin de cada ciclo menstrual y la identificacin de los perodos frtiles.  Mtodo de ovulacin: Paramedic las relaciones sexuales durante la ovulacin.  Mtodo sintotrmico: Advertising copywriter sexuales en la poca en la que se est ovulando, utilizando un termmetro y tendiendo en cuenta los sntomas de la ovulacin.  Mtodo postovulacin: Youth worker las relaciones sexuales para despus de haber ovulado. Independientemente del tipo o mtodo anticonceptivo que usted elija, es importante que use condones para protegerse contra las infecciones de transmisin sexual (ETS). Hable con su mdico con respecto a qu mtodo anticonceptivo es el ms apropiado para usted. Esta informacin no tiene Theme park manager el consejo del mdico. Asegrese de hacerle al mdico cualquier pregunta que tenga. Document Released: 11/04/2005 Document Revised: 07/07/2013 Document Reviewed: 04/29/2013 Elsevier Interactive Patient Education  2017 Elsevier Inc. Informacin sobre el dispositivo intrauterino (Intrauterine Device Information) Un dispositivo intrauterino (DIU) se inserta en el tero e impide el embarazo. Hay dos tipos de DIU:  DIU de cobre: este tipo de DIU est recubierto con un alambre de cobre y se inserta dentro del tero. El cobre hace que el tero y las trompas de Falopio produzcan un liquido que [employer] espermatozoides. El DIU de cobre puede Engineer, maintenance  lugar durante 10 aos.  DIU con hormona: este tipo de DIU contiene la hormona progestina (progesterona sinttica). Las hormonas hacen que el moco  cervical se haga ms espeso, lo que evita que el esperma ingrese al tero. Tambin hace que la membrana que recubre internamente al tero sea ms delgada lo que impide el implante del vulo fertilizado. La hormona debilita o destruye los espermatozoides que ingresan al tero. Alguno de los tipos de DIU hormonal pueden Geneticist, molecular durante 5 aos y otros tipos pueden dejarse en el lugar por 3 aos. El mdico se asegurar de que usted sea una buena candidata para usar el DIU. Converse con su mdico acerca de los posibles efectos secundarios. VENTAJASDEL DISPOSITIVO INTRAUTERINO  El DIU es muy eficaz, reversible, de accin prolongada y de bajo mantenimiento.  No hay efectos secundarios relacionados con el estrgeno.  El DIU puede ser utilizado durante la Market researcher.  No est asociado con el aumento de [place].  Funciona inmediatamente despus de la insercin.  El DIU hormonal funciona inmediatamente si se inserta dentro de los [address] del inicio del perodo. Ser necesario que utilice un mtodo anticonceptivo adicional durante 7 das si el DIU hormonal se inserta en algn otro momento del ciclo.  El DIU de cobre no interfiere con las hormonas femeninas.  El DIU hormonal puede hacer que los perodos menstruales abundantes se hagan ms ligeros y que haya menos clicos.  El DIU hormonal puede usarse durante 3 a 5 aos.  El DIU de cobre puede usarse durante 10 aos.  DESVENTAJASDEL DISPOSITIVO [employer] DIU hormonal puede estar asociado con patrones de sangrado irregular.  El DIU de cobre puede hacer que el flujo menstrual ms abundante y doloroso.  Puede experimentar clicos y sangrado vaginal despus de la insercin.  Esta informacin no tiene Theme park manager el consejo del mdico. Asegrese de hacerle al mdico cualquier pregunta que tenga. Document Released: 04/24/2010 Document Revised: 02/26/2016 Document Reviewed: 04/25/2013 Elsevier Interactive Patient Education   2017 [employer]. Etonogestrel implant Qu es este medicamento? El ETONOGESTREL es un dispositivo anticonceptivo (control de la natalidad). Se [place] para evitar los embarazos. Se puede usar hasta por 3 aos. Este medicamento puede ser utilizado para otros usos; si tiene alguna pregunta consulte con su proveedor de atencin mdica o con su farmacutico. MARCAS COMUNES: Implanon, Nexplanon Qu le debo informar a mi profesional de la salud antes de tomar este medicamento? Necesita saber si usted presenta alguno de los siguientes problemas o situaciones: sangrado vaginal anormal enfermedad vascular o cogulos sanguneos cncer de mama, cervical, heptico depresin diabetes enfermedad de la vescula biliar dolores de cabeza enfermedad cardiaca o ataque cardiaco reciente alta presin sangunea alto nivel de colesterol enfermedad renal enfermedad heptica convulsiones fuma tabaco una reaccin alrgica o inusual al etonogestrel, otras hormonas, anestsicos o antispticos, medicamentos, alimentos, colorantes o conservantes si est embarazada o buscando quedar embarazada si est amamantando a un beb Cmo debo [employer]? Un profesional de la salud inserta este dispositivo justo debajo de la piel en la parte interior del brazo. Hable con su pediatra para informarse acerca del uso de este medicamento en nios. Puede requerir atencin especial. Sobredosis: Pngase en contacto inmediatamente con un centro toxicolgico o una sala de urgencia si usted cree que haya tomado demasiado medicamento. ATENCIN: [employer] es solo para usted. No comparta este medicamento con nadie. Qu sucede si me olvido de una dosis? No se aplica en este caso. Qu puede interactuar con este medicamento?  No tome este medicamento con ninguno de los siguientes frmacos: amprenavir bosentano fosamprenavir Este medicamento tambin podra interactuar con los siguientes medicamentos: barbitricos para inducir el  sueo o para el tratamiento de convulsiones ciertos medicamentos para infecciones micticas, tales como itraconazol y Associate Professor jugo de toronja griseofulvina medicamentos para tratar convulsiones, tales como [place], [place], [place], [place], topiramato modafinilo fenilbutazona rifampicina rufinamida algunos medicamentos para tratar la infeccin por el VIH, tales como atazanavir, indinavir, lopinavir, nelfinavir, tipranavir, ritonavir hierba de [address] ser que esta lista no menciona todas las posibles interacciones. Informe a su profesional de [employer] de [employer] productos a base de hierbas, medicamentos de [place] o suplementos nutritivos que est tomando. Si usted fuma, consume bebidas alcohlicas o si utiliza drogas ilegales, indqueselo tambin a su profesional de [employer]. Algunas sustancias pueden interactuar con su medicamento. A qu debo estar atento al usar [employer]? Este producto no protege contra la infeccin por el VIH (SIDA) u otras enfermedades de transmisin sexual. Usted debe sentir el implante al presionar con las yemas de los dedos sobre la piel donde se insert. Contacte a su mdico si no se siente el implante y [place] un mtodo anticonceptivo no hormonal (como el condn) hasta que el mdico confirma que el implante est en su Environmental consultant. Si siente que el implante puede haber roto o doblado en su brazo, pngase en contacto con su proveedor de atencin mdica. Qu efectos secundarios puedo tener al [employer] este medicamento? Efectos secundarios que debe informar a su mdico o a Producer, television/film/video de la salud tan pronto como sea posible: Therapist, art, como erupcin cutnea, picazn o urticarias, e hinchazn de la cara, los labios o la lengua bultos en las mamas cambios en las emociones o el estado de nimo estado de nimo deprimido sangrado menstrual intenso o Software engineer, irritacin, hinchazn o Counsellor de la insercin Lobbyist de la insercin signos de infeccin en el sitio de insercin tales como fiebre, y enrojecimiento de la piel, Engineer, mining o secrecin signos de Psychiatrist signos y sntomas de un cogulo sanguneo, tales como problemas respiratorios; cambios en la visin; dolor en el pecho; dolor de cabeza severo, repentino; dolor, hinchazn, calor en la pierna; dificultad para hablar; entumecimiento o debilidad repentina de la cara, el brazo o la pierna signos y sntomas de lesin al hgado, como orina amarilla oscura o [place]; sensacin general de estar enfermo o sntomas gripales; heces claras; prdida de apetito; nuseas; dolor en la regin abdominal superior derecha; cansancio o debilidad inusual; color amarillento de los ojos o la piel sangrado vaginal inusual, secrecin signos y sntomas de un derrame cerebral, tales como cambios en la visin; confusin; dificultad para hablar o entender; dolores de cabeza severos; entumecimiento o debilidad repentina de la cara, el brazo o la pierna; problemas al Advertising account planner; Psychiatrist; prdida del equilibrio o la coordinacin Efectos secundarios que generalmente no requieren Psychologist, prison and probation services (infrmelos a su mdico o a Producer, television/film/video de la salud si persisten o si son molestos): acn dolor de Retail buyer en las mamas cambios de peso mareos sensacin general de estar enfermo o sntomas gripales dolor de cabeza sangrado menstrual irregular nuseas dolor de garganta irritacin o inflamacin vaginal Puede ser que esta lista no menciona todos los posibles efectos secundarios. Comunquese a su mdico por asesoramiento mdico [employer]. Usted puede informar los efectos secundarios a la FDA por telfono al 1-800-FDA-1088. Dnde debo guardar mi medicina? Este medicamento se administra en  hospitales o clnicas y no necesitar guardarlo en su domicilio. ATENCIN: Este folleto es un resumen. Puede ser que no cubra toda la posible informacin. Si usted tiene preguntas acerca de esta  medicina, consulte con su mdico, su farmacutico o su profesional de Radiographer, therapeutic.  2018 Elsevier/Gold Standard (2016-12-05 00:00:00)

## 2017-09-04 ENCOUNTER — Ambulatory Visit (INDEPENDENT_AMBULATORY_CARE_PROVIDER_SITE_OTHER): Payer: Self-pay | Admitting: Obstetrics & Gynecology

## 2017-09-04 VITALS — BP 105/64 | HR 87 | Wt 167.0 lb

## 2017-09-04 DIAGNOSIS — Z8751 Personal history of pre-term labor: Secondary | ICD-10-CM

## 2017-09-04 DIAGNOSIS — Z87898 Personal history of other specified conditions: Secondary | ICD-10-CM

## 2017-09-04 DIAGNOSIS — O0992 Supervision of high risk pregnancy, unspecified, second trimester: Secondary | ICD-10-CM

## 2017-09-04 DIAGNOSIS — O09212 Supervision of pregnancy with history of pre-term labor, second trimester: Secondary | ICD-10-CM

## 2017-09-04 NOTE — Progress Notes (Signed)
   PRENATAL VISIT NOTE  Subjective:  [name] is a 31 y.o. [phone] at 2245w0d being seen today for ongoing prenatal care.  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  Patient reports no complaints.  Contractions: Not present. Vag. Bleeding: None.  Movement: Present. Denies leaking of fluid.   The following portions of the patient's history were reviewed and updated as appropriate: allergies, current medications, past family history, past medical history, past social history, past surgical history and problem list. Problem list updated.  Objective:   Vitals:   09/04/17 1339  BP: 105/64  Pulse: 87  Weight: 167 lb (75.8 kg)    Fetal Status: Fetal Heart Rate (bpm): 154   Movement: Present     General:  Alert, oriented and cooperative. Patient is in no acute distress.  Skin: Skin is warm and dry. No rash noted.   Cardiovascular: Normal heart rate noted  Respiratory: Normal respiratory effort, no problems with respiration noted  Abdomen: Soft, gravid, appropriate for gestational age.  Pain/Pressure: Present     Pelvic: Cervical exam deferred        Extremities: Normal range of motion.  Edema: None  Mental Status:  Normal mood and affect. Normal behavior. Normal judgment and thought content.   Assessment and Plan:  Pregnancy: [id number] at 1145w0d  1. History of preterm delivery No Sx PTL  2. Supervision of high risk pregnancy, antepartum, second trimester Fetal growth - US MFM OB FOLLOW UP; Future  3. History of low birth weight  - [place] MFM OB FOLLOW UP; Future  Preterm labor symptoms and general obstetric precautions including but not limited to vaginal bleeding,  contractions, leaking of fluid and fetal movement were reviewed in detail with the patient. Please refer to After Visit Summary for other counseling recommendations.  Return in about 2 weeks (around 09/18/2017).   Scheryl DarterJames Arnold, MD

## 2017-09-04 NOTE — Patient Instructions (Signed)

## 2017-09-10 ENCOUNTER — Ambulatory Visit (HOSPITAL_COMMUNITY)
Admission: RE | Admit: 2017-09-10 | Discharge: 2017-09-10 | Disposition: A | Discharge status: Home / Self Care | Payer: Self-pay | Source: Ambulatory Visit | Attending: Obstetrics & Gynecology | Admitting: Obstetrics & Gynecology

## 2017-09-10 DIAGNOSIS — Z87898 Personal history of other specified conditions: Secondary | ICD-10-CM | POA: Insufficient documentation

## 2017-09-10 DIAGNOSIS — O0992 Supervision of high risk pregnancy, unspecified, second trimester: Secondary | ICD-10-CM | POA: Insufficient documentation

## 2017-09-17 ENCOUNTER — Ambulatory Visit (INDEPENDENT_AMBULATORY_CARE_PROVIDER_SITE_OTHER): Payer: Self-pay | Admitting: Family Medicine

## 2017-09-17 VITALS — BP 112/69 | HR 68 | Wt 168.9 lb

## 2017-09-17 DIAGNOSIS — O0992 Supervision of high risk pregnancy, unspecified, second trimester: Secondary | ICD-10-CM

## 2017-09-17 DIAGNOSIS — Z8751 Personal history of pre-term labor: Secondary | ICD-10-CM

## 2017-09-17 DIAGNOSIS — Z8759 Personal history of other complications of pregnancy, childbirth and the puerperium: Secondary | ICD-10-CM

## 2017-09-17 DIAGNOSIS — Z113 Encounter for screening for infections with a predominantly sexual mode of transmission: Secondary | ICD-10-CM

## 2017-09-17 DIAGNOSIS — Z789 Other specified health status: Secondary | ICD-10-CM

## 2017-09-17 DIAGNOSIS — O0993 Supervision of high risk pregnancy, unspecified, third trimester: Secondary | ICD-10-CM

## 2017-09-17 LAB — POCT URINALYSIS DIP (DEVICE)
Bilirubin Urine: NEGATIVE
GLUCOSE, UA: NEGATIVE mg/dL
KETONES UR: NEGATIVE mg/dL
Nitrite: NEGATIVE
PROTEIN: NEGATIVE mg/dL
SPECIFIC GRAVITY, URINE: 1.015 (ref 1.005–1.030)
Urobilinogen, UA: 2 mg/dL — ABNORMAL HIGH (ref 0.0–1.0)
pH: 7 (ref 5.0–8.0)

## 2017-09-17 NOTE — Progress Notes (Signed)
   PRENATAL VISIT NOTE  Subjective:  [name] is a 31 y.o. [phone] at 1559w6d being seen today for ongoing prenatal care.  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  Patient reports no complaints.  Contractions: Not present. Vag. Bleeding: None.  Movement: Present. Denies leaking of fluid.   The following portions of the patient's history were reviewed and updated as appropriate: allergies, current medications, past family history, past medical history, past social history, past surgical history and problem list. Problem list updated.  Objective:   Vitals:   09/17/17 1646  BP: 112/69  Pulse: 68  Weight: 168 lb 14.4 oz (76.6 kg)    Fetal Status: Fetal Heart Rate (bpm): 148 Fundal Height: 36 cm Movement: Present  Presentation: Vertex  General:  Alert, oriented and cooperative. Patient is in no acute distress.  Skin: Skin is warm and dry. No rash noted.   Cardiovascular: Normal heart rate noted  Respiratory: Normal respiratory effort, no problems with respiration noted  Abdomen: Soft, gravid, appropriate for gestational age.  Pain/Pressure: Absent     Pelvic: Cervical exam performed Dilation: 2 Effacement (%): 50 Station: -3  Extremities: Normal range of motion.  Edema: Mild pitting, slight indentation  Mental Status:  Normal mood and affect. Normal behavior. Normal judgment and thought content.   Assessment and Plan:  Pregnancy: [id number] at 10059w6d  1. Supervision of high risk pregnancy, antepartum, third trimester FHT and FH normal - Strep Gp B NAA - Cervicovaginal ancillary only  2. Supervision of high risk pregnancy, antepartum, second trimester  3. Language  barrier Interpreter used  4. History of preterm delivery  5. History of eclampsia Stop taking ASA  Preterm labor symptoms and general obstetric precautions including but not limited to vaginal bleeding, contractions, leaking of fluid and fetal movement were reviewed in detail with the patient. Please refer to After Visit Summary for other counseling recommendations.  Return in about 1 week (around 09/24/2017) for HR OB f/u.   Levie HeritageJacob J Stinson, DO

## 2017-09-18 LAB — OB RESULTS CONSOLE GBS: GBS: NEGATIVE

## 2017-09-19 LAB — CERVICOVAGINAL ANCILLARY ONLY
CHLAMYDIA, DNA PROBE: NEGATIVE
NEISSERIA GONORRHEA: NEGATIVE

## 2017-09-19 LAB — STREP GP B NAA: STREP GROUP B AG: NEGATIVE

## 2017-09-24 ENCOUNTER — Ambulatory Visit (INDEPENDENT_AMBULATORY_CARE_PROVIDER_SITE_OTHER): Payer: Self-pay | Admitting: Obstetrics and Gynecology

## 2017-09-24 VITALS — BP 122/72 | HR 71 | Wt 172.4 lb

## 2017-09-24 DIAGNOSIS — Z87898 Personal history of other specified conditions: Secondary | ICD-10-CM

## 2017-09-24 DIAGNOSIS — Z8751 Personal history of pre-term labor: Secondary | ICD-10-CM

## 2017-09-24 DIAGNOSIS — O0992 Supervision of high risk pregnancy, unspecified, second trimester: Secondary | ICD-10-CM

## 2017-09-24 DIAGNOSIS — Z8759 Personal history of other complications of pregnancy, childbirth and the puerperium: Secondary | ICD-10-CM

## 2017-09-24 DIAGNOSIS — O09212 Supervision of pregnancy with history of pre-term labor, second trimester: Secondary | ICD-10-CM

## 2017-09-24 LAB — POCT URINALYSIS DIP (DEVICE)
BILIRUBIN URINE: NEGATIVE
Glucose, UA: NEGATIVE mg/dL
Ketones, ur: NEGATIVE mg/dL
NITRITE: NEGATIVE
PH: 7 (ref 5.0–8.0)
PROTEIN: NEGATIVE mg/dL
Specific Gravity, Urine: 1.02 (ref 1.005–1.030)
Urobilinogen, UA: 0.2 mg/dL (ref 0.0–1.0)

## 2017-09-24 NOTE — Progress Notes (Signed)
Prenatal Visit Note Date: 09/24/2017 Clinic: [facility]  Subjective:  [name] is a 31 y.o. [phone] at 3212w6d being seen today for ongoing prenatal care.  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  Patient reports no complaints.   Contractions: Not present. Vag. Bleeding: None.  Movement: Present. Denies leaking of fluid.   The following portions of the patient's history were reviewed and updated as appropriate: allergies, current medications, past family history, past medical history, past social history, past surgical history and problem list. Problem list updated.  Objective:   Vitals:   09/24/17 1544  BP: 122/72  Pulse: 71  Weight: 172 lb 6.4 oz (78.2 kg)    Fetal Status: Fetal Heart Rate (bpm): 150 Fundal Height: 37 cm Movement: Present  Presentation: Vertex  General:  Alert, oriented and cooperative. Patient is in no acute distress.  Skin: Skin is warm and dry. No rash noted.   Cardiovascular: Normal heart rate noted  Respiratory: Normal respiratory effort, no problems with respiration noted  Abdomen: Soft, gravid, appropriate for gestational age. Pain/Pressure: Present     Pelvic:  Cervical exam deferred        Extremities: Normal range of motion.  Edema: Mild pitting, slight indentation  Mental Status: Normal mood and affect. Normal behavior. Normal judgment and thought content.   Urinalysis: Urine Protein: Negative Urine Glucose: Negative  Assessment and Plan:  Pregnancy: [id number] at 3212w6d  1. History of eclampsia Continue baby ASA. Normal BPs today  2. History of low birth weight Normal EFWs and ACs x 2 and no downward trend.  Rpt u/s prn based on FHs  3. Supervision of high risk pregnancy, antepartum, second trimester Vasectomy. Pt aware will need something until semen analysis is done at approx 3237m to ensure vasectomy was successful   Term labor symptoms and general obstetric precautions including but not limited to vaginal bleeding, contractions, leaking of fluid and fetal movement were reviewed in detail with the patient. Please refer to After Visit Summary for other counseling recommendations.  Return in about 1 week (around 10/01/2017) for rob.   [place] BingPickens, [name], MD

## 2017-10-03 ENCOUNTER — Ambulatory Visit (INDEPENDENT_AMBULATORY_CARE_PROVIDER_SITE_OTHER): Payer: Self-pay | Admitting: Obstetrics & Gynecology

## 2017-10-03 VITALS — BP 126/86 | HR 82 | Wt 171.0 lb

## 2017-10-03 DIAGNOSIS — O0993 Supervision of high risk pregnancy, unspecified, third trimester: Secondary | ICD-10-CM

## 2017-10-03 DIAGNOSIS — O0992 Supervision of high risk pregnancy, unspecified, second trimester: Secondary | ICD-10-CM

## 2017-10-03 NOTE — Patient Instructions (Signed)
Parto vaginal (Vaginal Delivery) Durante el parto, el mdico la ayudar a dar a luz a su beb. En elparto vaginal, deber pujar para que el beb salga por la vagina. Sin embargo, antes de que pueda sacar al beb, es necesario que ocurran ciertas cosas. La abertura del tero (cuello del tero) tiene que ablandarse, hacerse ms delgado y abrirse (dilatar) hasta que llegue a 10 cm. Adems, el beb tiene que bajar desde el tero a la vagina. SIGNOS DE TRABAJO DE PARTO  El mdico tendr primero que asegurarse de que usted est en trabajo de parto. Algunos signos son:   Eliminar lo que se llama tapn mucoso antes del inicio del trabajo de parto. Este es una pequea cantidad de mucosidad teida con sangre.  Tener contracciones uterinas regulares y dolorosas.   El tiempo entre las contracciones debe acortarse  Las molestias y el dolor se harn ms intensos gradualmente.  El dolor de las contracciones empeora al caminar y no se alivia con el reposo.   El cuello del tero se hace mas delgado (se borra) y se dilata. ANTES DEL PARTO Una vez que se inicie el trabajo de parto y sea admitida en el hospital o sanatorio, el mdico podr hacer lo siguiente:   Realizar un examen fsico.  Controlar si hay complicaciones relacionadas con el trabajo de parto.  Verificar su presin arterial, temperatura y pulso y la frecuencia cardaca (signos vitales).   Determinar si se ha roto el saco amnitico y cundo ha ocurrido.  Realizar un examen vaginal (utilizando un guante estril y un lubricante) para determinar: ? La posicin (presentacin) del beb. El beb se presenta con la cabeza primero (vertex) en el canal de parto (vagina), o estn los pies o las nalgas primero (de nalgas)? ? El nivel (estacin) de la cabeza del beb dentro del canal de parto. ? El borramiento y la dilatacin del cuello uterino  El monitor fetal electrnico generalmente se coloca sobre el abdomen al llegar. Se utiliza para  controlar las contracciones y la frecuencia cardaca del beb. ? Cuando el monitor est en el abdomen (monitor fetal externo), slo toma la frecuencia y la duracin de las contracciones. No informa acerca de la intensidad de las contracciones. ? Si el mdico necesita saber exactamente la intensidad de las contracciones o cul es la frecuencia cardaca del beb, colocar un monitor interno en la vagina y el tero. El mdico comentar los riesgos y los beneficios de usar un monitor interno y le pedir autorizacin antes de colocar el dispositivo. ? El monitoreo fetal continuo ser necesario si le han aplicado una epidural, si le administran ciertos medicamentos (como oxitocina) y si tiene complicaciones del embarazo o del trabajo de parto.  Podrn colocarle una va intravenosa en una vena del brazo para suministrarle lquidos y medicamentos, si es necesario. TRES ETAPAS DEL TRABAJO DE PARTO Y EL PARTO El trabajo de parto y el parto normales se dividen en tres etapas. Primera etapa Esta etapa comienza cuando comienzan las contracciones regulares y el cuello comienza a borrarse y dilatarse. Finaliza cuando el cuello est completamente abierto (completamente dilatado). La primera etapa es la etapa ms larga del trabajo de parto y puede durar desde 3 horas a 15 horas.  Algunos mtodos estn disponibles para ayudar con el dolor del parto. Usted y su mdico decidirn qu opcin es la mejor para usted. Las opciones incluyen:   Medicamentos narcticos. Estos son medicamentos fuertes que usted puede recibir a travs de una va intravenosa o   como inyeccin en el msculo. Estos medicamentos alivian el dolor pero no hacen que desaparezca completamente.  Epidural. Se administra un medicamento a travs de un tubo delgado que se inserta en la espalda. El medicamento adormece la parte inferior del cuerpo y evita el dolor en esa zona.  Bloqueo paracervical Es una inyeccin de un anestsico en cada lado del cuello  uterino.  Usted podr pedir un parto natural, que implica que no se usen analgsicos ni epidural durante el parto y el trabajo de parto. En cambio, podr tener otro tipo de ayuda como ejercicios respiratorios para hacer frente al dolor. Segunda etapa La segunda etapa del trabajo de parto comienza cuando el cuello se ha dilatado completamente a 10 cm. Contina hasta que usted puja al beb hacia abajo, por el canal de parto, y el beb nace. Esta etapa puede durar slo algunos minutos o algunas horas.  La posicin del la cabeza del beb a medida que pasa por el canal de parto, es informada como un nmero, llamado estacin. Si la cabeza del beb no ha iniciado su descenso, la estacin se describe como que est en menos 3 (-3). Cuando la cabeza del beb est en la estacin cero, est en el medio del canal de parto y se encaja en la pelvis. La estacin en la que se encuentra el beb indica el progreso de la segunda etapa del trabajo de parto.  Cuando el beb nace, el mdico lo sostendr con la cabeza hacia abajo para evitar que el lquido amnitico, el moco y la sangre entren en los pulmones del beb. La boca y la nariz del beb podrn ser succionadas con un pequeo bulbo para retirar todo lquido adicional.  El mdico podr colocar al beb sobre su estmago. Es importante evitar que el beb tome fro. Para hacerlo, el mdico secar al beb, lo colocar directamente sobre su piel, (sin mantas entre usted y el beb) y lo cubrir con mantas secas y tibias.  Se corta el cordn umbilical. Tercera etapa Durante la tercera etapa del trabajo de parto, el mdico sacar la placenta (alumbramiento) y se asegurar de que el sangrado est controlado. La salida de la placenta generalmente demora 5 minutos pero puede tardar hasta 30 minutos. Luego de la salida de la placenta, le darn un medicamento por va intravenosa o inyectable para ayudar a contraer el tero y controlar el sangrado. Si planea amamantar al beb,  puede intentar en este momento Luego de la salida de la placenta, el tero debe contraerse y quedar muy firme. Si el tero no queda firme, el mdico lo masajear. Esto es importante debido a que la contraccin del tero ayuda a cortar el sangrado en el sitio en que la placenta estaba unida al tero. Si el tero no se contrae adecuadamente ni permanece firme, podr causar un sangrado abundante. Si hay mucho sangrado, podrn darle medicamentos para contraer el tero y detener el sangrado.  Esta informacin no tiene como fin reemplazar el consejo del mdico. Asegrese de hacerle al mdico cualquier pregunta que tenga. Document Released: 10/17/2008 Document Revised: 11/25/2014 Elsevier Interactive Patient Education  2017 Elsevier Inc.  

## 2017-10-03 NOTE — Progress Notes (Signed)
, °

## 2017-10-03 NOTE — Progress Notes (Signed)
   PRENATAL VISIT NOTE  Subjective:  [name] is a 31 y.o. [id number] at 6047w1d being seen today for ongoing prenatal care.  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  Patient reports no complaints.  Contractions: Not present. Vag. Bleeding: None.  Movement: Present. Denies leaking of fluid.   The following portions of the patient's history were reviewed and updated as appropriate: allergies, current medications, past family history, past medical history, past social history, past surgical history and problem list. Problem list updated.  Objective:   Vitals:   10/03/17 1101  BP: 126/86  Pulse: 82  Weight: 171 lb (77.6 kg)    Fetal Status: Fetal Heart Rate (bpm): 152   Movement: Present     General:  Alert, oriented and cooperative. Patient is in no acute distress.  Skin: Skin is warm and dry. No rash noted.   Cardiovascular: Normal heart rate noted  Respiratory: Normal respiratory effort, no problems with respiration noted  Abdomen: Soft, gravid, appropriate for gestational age.  Pain/Pressure: Absent     Pelvic: Cervical exam deferred        Extremities: Normal range of motion.  Edema: Mild pitting, slight indentation  Mental Status:  Normal mood and affect. Normal behavior. Normal judgment and thought content.   Assessment and Plan:  Pregnancy: [id number] at 1947w1d  There are no diagnoses linked to this encounter. Term labor symptoms and general obstetric precautions including but not limited to vaginal bleeding, contractions, leaking of fluid and fetal movement were reviewed in detail with the patient. Please refer to After Visit Summary for other counseling  recommendations.  1 week f/u  Scheryl DarterJames Laramie Meissner, MD

## 2017-10-14 ENCOUNTER — Ambulatory Visit (INDEPENDENT_AMBULATORY_CARE_PROVIDER_SITE_OTHER): Payer: Self-pay | Admitting: Obstetrics and Gynecology

## 2017-10-14 ENCOUNTER — Encounter: Payer: Self-pay | Admitting: Medical

## 2017-10-14 ENCOUNTER — Encounter: Payer: Self-pay | Admitting: Obstetrics and Gynecology

## 2017-10-14 ENCOUNTER — Telehealth (HOSPITAL_COMMUNITY): Payer: Self-pay | Admitting: *Deleted

## 2017-10-14 VITALS — BP 122/79 | HR 62 | Wt 171.6 lb

## 2017-10-14 DIAGNOSIS — O0992 Supervision of high risk pregnancy, unspecified, second trimester: Secondary | ICD-10-CM

## 2017-10-14 DIAGNOSIS — Z789 Other specified health status: Secondary | ICD-10-CM

## 2017-10-14 DIAGNOSIS — O09212 Supervision of pregnancy with history of pre-term labor, second trimester: Secondary | ICD-10-CM

## 2017-10-14 DIAGNOSIS — Z8751 Personal history of pre-term labor: Secondary | ICD-10-CM

## 2017-10-14 DIAGNOSIS — Z87898 Personal history of other specified conditions: Secondary | ICD-10-CM

## 2017-10-14 DIAGNOSIS — Z8759 Personal history of other complications of pregnancy, childbirth and the puerperium: Secondary | ICD-10-CM

## 2017-10-14 LAB — POCT URINALYSIS DIP (DEVICE)
BILIRUBIN URINE: NEGATIVE
GLUCOSE, UA: NEGATIVE mg/dL
Hgb urine dipstick: NEGATIVE
KETONES UR: NEGATIVE mg/dL
Nitrite: NEGATIVE
Protein, ur: NEGATIVE mg/dL
Specific Gravity, Urine: 1.02 (ref 1.005–1.030)
Urobilinogen, UA: 0.2 mg/dL (ref 0.0–1.0)
pH: 7 (ref 5.0–8.0)

## 2017-10-14 NOTE — Patient Instructions (Signed)
Parto vaginal (Vaginal Delivery) Durante el parto, el mdico la ayudar a dar a luz a su beb. En elparto vaginal, deber pujar para que el beb salga por la vagina. Sin embargo, antes de que pueda sacar al beb, es necesario que ocurran ciertas cosas. La abertura del tero (cuello del tero) tiene que ablandarse, hacerse ms delgado y abrirse (dilatar) hasta que llegue a 10 cm. Adems, el beb tiene que bajar desde el tero a la vagina. SIGNOS DE TRABAJO DE PARTO  El mdico tendr primero que asegurarse de que usted est en trabajo de parto. Algunos signos son:   Eliminar lo que se llama tapn mucoso antes del inicio del trabajo de parto. Este es una pequea cantidad de mucosidad teida con sangre.  Tener contracciones uterinas regulares y dolorosas.   El tiempo entre las contracciones debe acortarse  Las molestias y el dolor se harn ms intensos gradualmente.  El dolor de las contracciones empeora al caminar y no se alivia con el reposo.   El cuello del tero se hace mas delgado (se borra) y se dilata. ANTES DEL PARTO Una vez que se inicie el trabajo de parto y sea admitida en el hospital o sanatorio, el mdico podr hacer lo siguiente:   Realizar un examen fsico.  Controlar si hay complicaciones relacionadas con el trabajo de parto.  Verificar su presin arterial, temperatura y pulso y la frecuencia cardaca (signos vitales).   Determinar si se ha roto el saco amnitico y cundo ha ocurrido.  Realizar un examen vaginal (utilizando un guante estril y un lubricante) para determinar: ? La posicin (presentacin) del beb. El beb se presenta con la cabeza primero (vertex) en el canal de parto (vagina), o estn los pies o las nalgas primero (de nalgas)? ? El nivel (estacin) de la cabeza del beb dentro del canal de parto. ? El borramiento y la dilatacin del cuello uterino  El monitor fetal electrnico generalmente se coloca sobre el abdomen al llegar. Se utiliza para  controlar las contracciones y la frecuencia cardaca del beb. ? Cuando el monitor est en el abdomen (monitor fetal externo), slo toma la frecuencia y la duracin de las contracciones. No informa acerca de la intensidad de las contracciones. ? Si el mdico necesita saber exactamente la intensidad de las contracciones o cul es la frecuencia cardaca del beb, colocar un monitor interno en la vagina y el tero. El mdico comentar los riesgos y los beneficios de usar un monitor interno y le pedir autorizacin antes de colocar el dispositivo. ? El monitoreo fetal continuo ser necesario si le han aplicado una epidural, si le administran ciertos medicamentos (como oxitocina) y si tiene complicaciones del embarazo o del trabajo de parto.  Podrn colocarle una va intravenosa en una vena del brazo para suministrarle lquidos y medicamentos, si es necesario. TRES ETAPAS DEL TRABAJO DE PARTO Y EL PARTO El trabajo de parto y el parto normales se dividen en tres etapas. Primera etapa Esta etapa comienza cuando comienzan las contracciones regulares y el cuello comienza a borrarse y dilatarse. Finaliza cuando el cuello est completamente abierto (completamente dilatado). La primera etapa es la etapa ms larga del trabajo de parto y puede durar desde 3 horas a 15 horas.  Algunos mtodos estn disponibles para ayudar con el dolor del parto. Usted y su mdico decidirn qu opcin es la mejor para usted. Las opciones incluyen:   Medicamentos narcticos. Estos son medicamentos fuertes que usted puede recibir a travs de una va intravenosa o   como inyeccin en el msculo. Estos medicamentos alivian el dolor pero no hacen que desaparezca completamente.  Epidural. Se administra un medicamento a travs de un tubo delgado que se inserta en la espalda. El medicamento adormece la parte inferior del cuerpo y evita el dolor en esa zona.  Bloqueo paracervical Es una inyeccin de un anestsico en cada lado del cuello  uterino.  Usted podr pedir un parto natural, que implica que no se usen analgsicos ni epidural durante el parto y el trabajo de parto. En cambio, podr tener otro tipo de ayuda como ejercicios respiratorios para hacer frente al dolor. Segunda etapa La segunda etapa del trabajo de parto comienza cuando el cuello se ha dilatado completamente a 10 cm. Contina hasta que usted puja al beb hacia abajo, por el canal de parto, y el beb nace. Esta etapa puede durar slo algunos minutos o algunas horas.  La posicin del la cabeza del beb a medida que pasa por el canal de parto, es informada como un nmero, llamado estacin. Si la cabeza del beb no ha iniciado su descenso, la estacin se describe como que est en menos 3 (-3). Cuando la cabeza del beb est en la estacin cero, est en el medio del canal de parto y se encaja en la pelvis. La estacin en la que se encuentra el beb indica el progreso de la segunda etapa del trabajo de parto.  Cuando el beb nace, el mdico lo sostendr con la cabeza hacia abajo para evitar que el lquido amnitico, el moco y la sangre entren en los pulmones del beb. La boca y la nariz del beb podrn ser succionadas con un pequeo bulbo para retirar todo lquido adicional.  El mdico podr colocar al beb sobre su estmago. Es importante evitar que el beb tome fro. Para hacerlo, el mdico secar al beb, lo colocar directamente sobre su piel, (sin mantas entre usted y el beb) y lo cubrir con mantas secas y tibias.  Se corta el cordn umbilical. Tercera etapa Durante la tercera etapa del trabajo de parto, el mdico sacar la placenta (alumbramiento) y se asegurar de que el sangrado est controlado. La salida de la placenta generalmente demora 5 minutos pero puede tardar hasta 30 minutos. Luego de la salida de la placenta, le darn un medicamento por va intravenosa o inyectable para ayudar a contraer el tero y controlar el sangrado. Si planea amamantar al beb,  puede intentar en este momento Luego de la salida de la placenta, el tero debe contraerse y quedar muy firme. Si el tero no queda firme, el mdico lo masajear. Esto es importante debido a que la contraccin del tero ayuda a cortar el sangrado en el sitio en que la placenta estaba unida al tero. Si el tero no se contrae adecuadamente ni permanece firme, podr causar un sangrado abundante. Si hay mucho sangrado, podrn darle medicamentos para contraer el tero y detener el sangrado.  Esta informacin no tiene como fin reemplazar el consejo del mdico. Asegrese de hacerle al mdico cualquier pregunta que tenga. Document Released: 10/17/2008 Document Revised: 11/25/2014 Elsevier Interactive Patient Education  2017 Elsevier Inc.  

## 2017-10-14 NOTE — Telephone Encounter (Signed)
[phone] interpreter number  Preadmission screen

## 2017-10-14 NOTE — Progress Notes (Signed)
[employer] interpreter Lanora ManisElizabeth [phone]

## 2017-10-14 NOTE — Progress Notes (Signed)
Subjective:  [name] is a 31 y.o. [phone] at 3427w5d being seen today for ongoing prenatal care.  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  Patient reports no complaints.  Contractions: Not present. Vag. Bleeding: None.  Movement: Present. Denies leaking of fluid.   The following portions of the patient's history were reviewed and updated as appropriate: allergies, current medications, past family history, past medical history, past social history, past surgical history and problem list. Problem list updated.  Objective:   Vitals:   10/14/17 0807  BP: 122/79  Pulse: 62  Weight: 171 lb 9.6 oz (77.8 kg)    Fetal Status: Fetal Heart Rate (bpm): 137   Movement: Present     General:  Alert, oriented and cooperative. Patient is in no acute distress.  Skin: Skin is warm and dry. No rash noted.   Cardiovascular: Normal heart rate noted  Respiratory: Normal respiratory effort, no problems with respiration noted  Abdomen: Soft, gravid, appropriate for gestational age. Pain/Pressure: Absent     Pelvic:  Cervical exam performed        Extremities: Normal range of motion.  Edema: None  Mental Status: Normal mood and affect. Normal behavior. Normal judgment and thought content.   Urinalysis:      Assessment and Plan:  Pregnancy: [id number] at 7427w5d  1. Supervision of high risk pregnancy, antepartum, second trimester Stable Labor precautions BPP/NST next OB visit IOL at 41 weeks scheduled 2. Language barrier Interrupter services used  3. History of preterm delivery   4. History of eclampsia   5. History of low birth weight Stable FH and good interval growth on last  U/S.  Term labor symptoms and general obstetric precautions including but not limited to vaginal bleeding, contractions, leaking of fluid and fetal movement were reviewed in detail with the patient. Please refer to After Visit Summary for other counseling recommendations.  Return in about 1 week (around 10/21/2017) for OB visit.   Hermina StaggersErvin, Auset Fritzler L, MD

## 2017-10-19 ENCOUNTER — Encounter (HOSPITAL_COMMUNITY): Payer: Self-pay | Admitting: Anesthesiology

## 2017-10-19 ENCOUNTER — Other Ambulatory Visit: Payer: Self-pay | Admitting: Obstetrics and Gynecology

## 2017-10-19 ENCOUNTER — Inpatient Hospital Stay (HOSPITAL_COMMUNITY)
Admission: AD | Admit: 2017-10-19 | Discharge: 2017-10-20 | DRG: 807 | Disposition: A | Discharge status: Home / Self Care | Payer: Medicaid Other | Source: Ambulatory Visit | Attending: Obstetrics & Gynecology | Admitting: Obstetrics & Gynecology

## 2017-10-19 ENCOUNTER — Encounter (HOSPITAL_COMMUNITY): Payer: Self-pay

## 2017-10-19 ENCOUNTER — Other Ambulatory Visit: Payer: Self-pay

## 2017-10-19 DIAGNOSIS — O480 Post-term pregnancy: Secondary | ICD-10-CM

## 2017-10-19 DIAGNOSIS — Z3A40 40 weeks gestation of pregnancy: Secondary | ICD-10-CM

## 2017-10-19 DIAGNOSIS — Z3483 Encounter for supervision of other normal pregnancy, third trimester: Secondary | ICD-10-CM | POA: Diagnosis present

## 2017-10-19 LAB — CBC
HEMATOCRIT: 36.6 % (ref 36.0–46.0)
HEMOGLOBIN: 12.2 g/dL (ref 12.0–15.0)
MCH: 32.5 pg (ref 26.0–34.0)
MCHC: 33.3 g/dL (ref 30.0–36.0)
MCV: 97.6 fL (ref 78.0–100.0)
Platelets: 241 10*3/uL (ref 150–400)
RBC: 3.75 MIL/uL — ABNORMAL LOW (ref 3.87–5.11)
RDW: 13.8 % (ref 11.5–15.5)
WBC: 7.6 10*3/uL (ref 4.0–10.5)

## 2017-10-19 LAB — TYPE AND SCREEN
ABO/RH(D): O POS
Antibody Screen: NEGATIVE

## 2017-10-19 LAB — HIV ANTIBODY (ROUTINE TESTING W REFLEX): HIV Screen 4th Generation wRfx: NONREACTIVE

## 2017-10-19 LAB — RPR: RPR: NONREACTIVE

## 2017-10-19 LAB — ABO/RH: ABO/RH(D): O POS

## 2017-10-19 MED ORDER — SIMETHICONE 80 MG PO CHEW: 80.0000 mg | CHEWABLE_TABLET | ORAL | Status: DC | PRN

## 2017-10-19 MED ORDER — ONDANSETRON HCL 4 MG PO TABS: 4.0000 mg | ORAL_TABLET | ORAL | Status: DC | PRN

## 2017-10-19 MED ORDER — TETANUS-DIPHTH-ACELL PERTUSSIS 5-2.5-18.5 LF-MCG/0.5 IM SUSP: 0.5000 mL | Freq: Once | INTRAMUSCULAR | Status: DC

## 2017-10-19 MED ORDER — COCONUT OIL OIL: 1.0000 "application " | TOPICAL_OIL | Status: DC | PRN

## 2017-10-19 MED ORDER — DIPHENHYDRAMINE HCL 25 MG PO CAPS: 25.0000 mg | ORAL_CAPSULE | Freq: Four times a day (QID) | ORAL | Status: DC | PRN

## 2017-10-19 MED ORDER — OXYCODONE-ACETAMINOPHEN 5-325 MG PO TABS: 1.0000 | ORAL_TABLET | ORAL | Status: DC | PRN

## 2017-10-19 MED ORDER — WITCH HAZEL-GLYCERIN EX PADS: 1.0000 "application " | MEDICATED_PAD | CUTANEOUS | Status: DC | PRN

## 2017-10-19 MED ORDER — LACTATED RINGERS IV SOLN: 500.0000 mL | INTRAVENOUS | Status: DC | PRN

## 2017-10-19 MED ORDER — BENZOCAINE-MENTHOL 20-0.5 % EX AERO: 1.0000 "application " | INHALATION_SPRAY | CUTANEOUS | Status: DC | PRN

## 2017-10-19 MED ORDER — DIBUCAINE 1 % RE OINT: 1.0000 "application " | TOPICAL_OINTMENT | RECTAL | Status: DC | PRN

## 2017-10-19 MED ORDER — ACETAMINOPHEN 325 MG PO TABS: 650.0000 mg | ORAL_TABLET | ORAL | Status: DC | PRN

## 2017-10-19 MED ORDER — LIDOCAINE HCL (PF) 1 % IJ SOLN
30.0000 mL | INTRAMUSCULAR | Status: DC | PRN
  Filled 2017-10-19: qty 30

## 2017-10-19 MED ORDER — OXYTOCIN BOLUS FROM INFUSION
500.0000 mL | Freq: Once | INTRAVENOUS | Status: AC
  Administered 2017-10-19: 500 mL via INTRAVENOUS

## 2017-10-19 MED ORDER — SOD CITRATE-CITRIC ACID 500-334 MG/5ML PO SOLN: 30.0000 mL | ORAL | Status: DC | PRN

## 2017-10-19 MED ORDER — SENNOSIDES-DOCUSATE SODIUM 8.6-50 MG PO TABS
2.0000 | ORAL_TABLET | ORAL | Status: DC
  Administered 2017-10-19: 2 via ORAL
  Filled 2017-10-19: qty 2

## 2017-10-19 MED ORDER — IBUPROFEN 600 MG PO TABS
600.0000 mg | ORAL_TABLET | Freq: Four times a day (QID) | ORAL | Status: DC
  Administered 2017-10-19 – 2017-10-20 (×4): 600 mg via ORAL
  Filled 2017-10-19 (×4): qty 1

## 2017-10-19 MED ORDER — ONDANSETRON HCL 4 MG/2ML IJ SOLN: 4.0000 mg | INTRAMUSCULAR | Status: DC | PRN

## 2017-10-19 MED ORDER — METHYLERGONOVINE MALEATE 0.2 MG/ML IJ SOLN
INTRAMUSCULAR | Status: AC
  Administered 2017-10-19: 0.2 mg
  Filled 2017-10-19: qty 1

## 2017-10-19 MED ORDER — ONDANSETRON HCL 4 MG/2ML IJ SOLN: 4.0000 mg | Freq: Four times a day (QID) | INTRAMUSCULAR | Status: DC | PRN

## 2017-10-19 MED ORDER — PRENATAL MULTIVITAMIN CH
1.0000 | ORAL_TABLET | Freq: Every day | ORAL | Status: DC
  Administered 2017-10-20: 1 via ORAL
  Filled 2017-10-19: qty 1

## 2017-10-19 MED ORDER — LACTATED RINGERS IV SOLN
INTRAVENOUS | Status: DC
  Administered 2017-10-19: 08:00:00 via INTRAVENOUS

## 2017-10-19 MED ORDER — ZOLPIDEM TARTRATE 5 MG PO TABS: 5.0000 mg | ORAL_TABLET | Freq: Every evening | ORAL | Status: DC | PRN

## 2017-10-19 MED ORDER — OXYCODONE-ACETAMINOPHEN 5-325 MG PO TABS: 2.0000 | ORAL_TABLET | ORAL | Status: DC | PRN

## 2017-10-19 MED ORDER — OXYTOCIN 40 UNITS IN LACTATED RINGERS INFUSION - SIMPLE MED
2.5000 [IU]/h | INTRAVENOUS | Status: DC
  Administered 2017-10-19: 2.5 [IU]/h via INTRAVENOUS
  Filled 2017-10-19: qty 1000

## 2017-10-19 NOTE — Anesthesia Pain Management Evaluation Note (Signed)
  CRNA Pain Management Visit Note  Patient: [name], 31 y.o., female  "Hello I am a member of the anesthesia [facility]. We have an anesthesia team available at all times to provide care throughout the hospital, including epidural management and anesthesia for C-section. I don't know your plan for the delivery whether it a natural birth, water birth, IV sedation, nitrous supplementation, doula or epidural, but we want to meet your pain goals."   1.Was your pain managed to your expectations on prior hospitalizations?   Yes   2.What is your expectation for pain management during this hospitalization?     Labor support without medications  3.How can we help you reach that goal? Support prn  Record the patient's initial score and the patient's pain goal.   Pain: 7  Pain Goal: 9   Pain assessment report to CRNA by RN's prior assessment with Spanish interpreter.  [facility] wants you to be able to say your pain was always managed very well.  [facility],[name] 10/19/2017

## 2017-10-19 NOTE — H&P (Signed)
LABOR AND DELIVERY ADMISSION HISTORY AND PHYSICAL NOTE  [name] is a 31 y.o. female [phone] with IUP at 965w3d by LMP +19-wk U/S presenting for SOL.  She reports positive fetal movement. She denies leakage of fluid or vaginal bleeding.  Prenatal History/Complications: PNC at [facility] Pregnancy complications:  - History of eclampsia - History of PTD(d/t HTN) - Language barrier (Spanis is primary language)  Past Medical History: Past Medical History:  Diagnosis Date  . H/O eclampsia   . History of severe pre-eclampsia 2005/2008   1st and 2nd pregnancy    Past Surgical History: Past Surgical History:  Procedure Laterality Date  . NO PAST SURGERIES      Obstetrical History: OB History    Gravida Para Term Preterm AB Living   4 3 1 2  0 3   SAB TAB Ectopic Multiple Live Births   0 0 0 0 3      Obstetric Comments   Iatrogenic PTB x 2 (eclampsia and severe pre-eclampsia)      Social History: Social History   Socioeconomic History  . Marital status: Single    Spouse name: None  . Number of children: None  . Years of education: None  . Highest education level: None  Social Needs  . Financial resource strain: None  . Food insecurity - worry: None  . Food insecurity - inability: None  . Transportation needs - medical: None  . Transportation needs - non-medical: None  Occupational History  . None  Tobacco Use  . Smoking status: Never Smoker  . Smokeless tobacco: Never Used  Substance and Sexual Activity  . Alcohol use: No  . Drug use: No  . Sexual activity: Yes    Birth control/protection: None  Other Topics Concern  . None  Social History Narrative  . None    Family History: Family History  Problem Relation Age of Onset  . Diabetes Mother   . Hypertension Father     Allergies: No Known Allergies  Medications Prior to Admission  Medication Sig Dispense Refill Last Dose  . prenatal vitamin w/FE, FA (PRENATAL 1 + 1) 27-1 MG TABS Take 1  tablet by mouth daily.     Past Week at Unknown time  . aspirin EC 81 MG tablet Take 81 mg by mouth daily.   Not Taking     Review of Systems  All systems reviewed and negative except as stated in HPI  Physical Exam Blood pressure 126/83, pulse 60, temperature 97.8 F (36.6 C), temperature source Oral, resp. rate 16, height 5\' 3"  (1.6 m), weight 174 lb 12 oz (79.3 kg), last menstrual period 01/09/2017, SpO2 100 %, unknown if currently breastfeeding. General appearance: alert, oriented. Breathing through contractions Lungs: normal respiratory effort Heart: regular rate Abdomen: soft, non-tender; gravid, FH appropriate for GA Extremities: No calf swelling or tenderness Presentation: cephalic by SVE Fetal monitoring: baseline rate 145, moderate variability, +acel, no decel Uterine activity: ctx q~703min Dilation: 4.5 Effacement (%): 70, 80 Station: -2 Exam by:: Remigio EisenmengerBenji Stanley RN  Prenatal labs: ABO, Rh: --/--/O POS (12/02 0800) Antibody: PENDING (12/02 0800) Rubella: Immune (05/24 0830) RPR: Non Reactive (09/06 0836)  HBsAg:   negative (04/11/17; [facility] records) HIV: Non-reactive (05/24 0830)  GC/Chlamydia: negative GBS: Negative (11/01 0000)  2-hr GTT: negative Genetic screening:  Normal quad screen Anatomy US: normal detailed fetal anatomy at 19 weeks  Prenatal Transfer Tool  Maternal Diabetes: No Genetic Screening: Normal Maternal Ultrasounds/Referrals: Normal Fetal Ultrasounds or other Referrals:  None Maternal  Substance Abuse:  No Significant Maternal Medications:  None Significant Maternal Lab Results: Lab values include: Group B Strep negative  Results for orders placed or performed during the hospital encounter of 10/19/17 (from the past 24 hour(s))  CBC   Collection Time: 10/19/17  8:00 AM  Result Value Ref Range   WBC 7.6 4.0 - 10.5 K/uL   RBC 3.75 (L) 3.87 - 5.11 MIL/uL   Hemoglobin 12.2 12.0 - 15.0 g/dL   HCT 16.136.6 09.636.0 - 04.546.0 %   MCV 97.6 78.0 - 100.0 fL    MCH 32.5 26.0 - 34.0 pg   MCHC 33.3 30.0 - 36.0 g/dL   RDW 40.913.8 81.111.5 - 91.415.5 %   Platelets 241 150 - 400 K/uL  Type and screen [facility]   Collection Time: 10/19/17  8:00 AM  Result Value Ref Range   ABO/RH(D) O POS    Antibody Screen PENDING    Sample Expiration 10/22/2017     Patient Active Problem List   Diagnosis Date Noted  . Normal labor 10/19/2017  . History of eclampsia 05/08/2017  . History of low birth weight 05/08/2017  . Supervision of high risk pregnancy, antepartum, second trimester 05/08/2017  . History of preterm delivery 05/08/2017  . Language barrier 05/08/2017  . 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 [id number] at 628w3d here for SOL  #Labor: expectant management #Pain: Supportive; IV pain meds. Not planning on epidural #FWB: Cat I #ID:  GBS neg #MOF: breast and bottle #MOC: vasectomy #Circ:  N/a (girl)  Kandra NicolasJulie P Degele 10/19/2017, 9:08 AM

## 2017-10-19 NOTE — MAU Note (Addendum)
At 4 am, went to bathroom saw blood in toilet and wiped twice and saw blood tinge mucus but when left home to come here, only a little bloody mucus. No watery leaking. Baby moving well. Contractions every 10 min. Last tues, was 2 cm in office

## 2017-10-20 MED ORDER — MEDROXYPROGESTERONE ACETATE 150 MG/ML IM SUSP
150.0000 mg | Freq: Once | INTRAMUSCULAR | Status: AC
  Administered 2017-10-20: 150 mg via INTRAMUSCULAR
  Filled 2017-10-20: qty 1

## 2017-10-20 MED ORDER — IBUPROFEN 600 MG PO TABS: 600.0000 mg | ORAL_TABLET | Freq: Four times a day (QID) | ORAL | 0 refills | Status: DC

## 2017-10-20 NOTE — Discharge Summary (Signed)
OB Discharge Summary     Patient Name: [name] DOB: 03/01/1986 MRN: [id number] Date of admission: 10/19/2017  Delivering MD: Wyvonnia DuskyLAWSON, MARIE D )  Date of discharge: 10/20/2017    Admitting diagnosis: pregnancy at [redacted] weeks gestation Intrauterine pregnancy: 4168w3d    Secondary diagnosis:  Active Problems:   Patient Active Problem List   Diagnosis Date Noted  . Normal labor 10/19/2017  . History of eclampsia 05/08/2017  . History of low birth weight 05/08/2017  . Supervision of high risk pregnancy, antepartum, second trimester 05/08/2017  . History of preterm delivery 05/08/2017  . Language barrier 05/08/2017  . 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 yo [id number] at 5068w3d was admitted in Active Labor on 10/19/2017. Patient had an uncomplicated labor course as follows:  Membrane Rupture Time/Date: 11:14 AM ,10/19/2017   Intrapartum Procedures: Episiotomy: None [1]                                         Lacerations:  1st degree [2]  Patient had a delivery of a Viable infant. 10/19/2017  Information for the patient's newborn:  Guzman, Girl Earley AbideHilda [[id number]][[id number]]  Delivery Method: Vaginal, Spontaneous(Filed from Delivery Summary)    Pateint had an uncomplicated postpartum course.  She is ambulating, tolerating a regular diet, passing flatus, and urinating well. Patient is discharged home in stable condition on 10/20/17.   Physical exam  Vitals:   10/19/17 2323 10/20/17 0521  BP: 111/75 115/76  Pulse: 63 73  Resp:    Temp: 98 F (36.7 C) 98 F (36.7 C)  SpO2: 98% 96%    General: alert, cooperative and no distress Lochia:  appropriate Uterine Fundus: firm Incision: N/A DVT Evaluation: No evidence of DVT seen on physical exam.  Labs: No results found for this or any previous visit (from the past 24 hour(s)).   Discharge instruction: per After Visit Summary and "Baby and Me Booklet".  After visit meds:  No Known Allergies  Allergies as of 10/20/2017   No Known Allergies     Medication List    TAKE these medications   ibuprofen 600 MG tablet Commonly known as:  ADVIL,MOTRIN Take 1 tablet (600 mg total) by mouth every 6 (six) hours.   prenatal vitamin w/FE, FA 27-1 MG Tabs tablet Take 1 tablet by mouth daily.        Diet: routine diet  Activity: Advance as tolerated. Pelvic rest for 6 weeks.   Outpatient follow up:4 weeks Future Appointments: No future appointments.  Follow up Appt: No Follow-up on file.     Postpartum contraception: Depo Provera  Newborn  Data: APGAR (1 MIN): 8   APGAR (5 MINS): 9      Baby Feeding: Bottle and Breast Disposition:home with mother  Rolm Bookbindermber Isla Sabree, DO  10/20/2017

## 2017-10-20 NOTE — Progress Notes (Signed)
POSTPARTUM PROGRESS NOTE  Post Partum Day 1 Subjective:  [name] is a 31 y.o. [id number] 3929w3d s/p SVD.  No acute events overnight.  Pt denies problems with ambulating, voiding or po intake.  She denies nausea or vomiting.  Pain is well controlled.  She has not had flatus. She has not had bowel movement.  Lochia Small.   Objective: Blood pressure 115/76, pulse 73, temperature 98 F (36.7 C), temperature source Oral, resp. rate 18, height 5\' 3"  (1.6 m), weight 79.3 kg (174 lb 12 oz), last menstrual period 01/09/2017, SpO2 96 %, unknown if currently breastfeeding.  Physical Exam:  General: alert, cooperative and no distress Lochia:minimal flow  Chest: no respiratory distress Heart:regular rate  Abdomen: soft, nontender,  Uterine Fundus: firm, appropriately tender DVT Evaluation: No calf swelling or tenderness Extremities: No edema  Recent Labs    10/19/17 0800  HGB 12.2  HCT 36.6    Assessment/Plan:  ASSESSMENT: [name] is a 31 y.o. [id number] 2729w3d s/p SVD PPD#1  Who has a history of eclampsia amd is to doing well with no acute events over night. She has not current complaints. States baby is feeding well and would like to start Depo for contraception once discharged. She looks well enough to discharge today.  Discharge home   LOS: 1 day   [name]  LumDO 10/20/2017, 7:48 AM

## 2017-10-20 NOTE — Lactation Note (Signed)
This note was copied from a baby's chart. Lactation Consultation Note  Patient Name: Girl Earley AbideHilda Esquivel-Gutierrez RUEAV'WToday's Date: 10/20/2017 Reason for consult: Initial assessment;Term   Initial consult with mom of 6824 hour old infant. Spoke with mom with assistance of [facility]pre[facility], Eda Royal.   Mom reports BF is going well. Mom denies pain with latch. [facility] for [facility] at this time. Infant asleep in dad's arms and mom is eating lunch.   Enc mom to feed infant STS 8-12 x in 24 hours at first feeding cues. Enc mom to use pillow and head support with feeding. Enc mom to hand express before feedings to get milk flowing, mom reports she is aware of how to hand express.   BF Resources handout and LC Brochure given, mom informed of IP/OP Services, BF Support Groups and LC phone #. [facility] Mom does not have a pump at home.   Enc mom to call out for assistance as needed. Mom reports she has no questions/concerns at this time.      Maternal Data Formula Feeding for Exclusion: Yes Reason for exclusion: Mother's choice to formula and breast feed on admission Has patient been taught Hand Expression?: Yes Does the patient have breastfeeding experience prior to this delivery?: Yes  Feeding Feeding Type: Breast Fed Length of feed: 10 min  LATCH Score Latch: Grasps breast easily, tongue down, lips flanged, rhythmical sucking.  Audible Swallowing: A few with stimulation  Type of Nipple: Everted at rest and after stimulation  Comfort (Breast/Nipple): Soft / non-tender  Hold (Positioning): Assistance needed to correctly position infant at breast and maintain latch.  LATCH Score: 8  Interventions Interventions: Breast feeding basics reviewed;Support pillows;Hand express  Lactation Tools Discussed/Used WIC Program: Yes   Consult Status Consult Status: Follow-up Date: 10/21/17 Follow-up type: In-patient    Silas FloodSharon S  [facility] 10/20/2017, 12:57 PM

## 2017-10-21 ENCOUNTER — Other Ambulatory Visit: Payer: Self-pay

## 2017-10-21 ENCOUNTER — Encounter: Payer: Self-pay | Admitting: Family Medicine

## 2017-10-23 ENCOUNTER — Inpatient Hospital Stay (HOSPITAL_COMMUNITY): Payer: Self-pay

## 2017-12-01 ENCOUNTER — Ambulatory Visit: Payer: Self-pay | Admitting: Advanced Practice Midwife

## 2017-12-17 ENCOUNTER — Ambulatory Visit (INDEPENDENT_AMBULATORY_CARE_PROVIDER_SITE_OTHER): Payer: Self-pay

## 2017-12-17 NOTE — Progress Notes (Signed)
Pt states was given a Depo Shot before she left hospital.

## 2017-12-17 NOTE — Progress Notes (Signed)
Subjective:     [name] is a 32 y.o. female who presents for a postpartum visit. She is 8 weeks postpartum following a spontaneous vaginal delivery. I have fully reviewed the prenatal and intrapartum course. The delivery was at 40/3 gestational weeks. Outcome: spontaneous vaginal delivery. Anesthesia: none. Postpartum course has been uneventful. Baby's course has been uneventful. Baby is feeding by bottle - Gerber Gentle. Bleeding thin lochia. Bowel function is normal. Bladder function is normal. Patient is not sexually active. Contraception method is Depo-Provera injections. Postpartum depression screening: negative.  The following portions of the patient's history were reviewed and updated as appropriate: allergies, current medications, past family history, past medical history, past social history, past surgical history and problem list.  Review of Systems Pertinent items noted in HPI and remainder of comprehensive ROS otherwise negative.   Objective:    LMP 01/09/2017 (Approximate)   General:  alert, cooperative and no distress   Breasts:  inspection negative, no nipple discharge or bleeding, no masses or nodularity palpable  Lungs: clear to auscultation bilaterally  Heart:  regular rate and rhythm, S1, S2 normal, no murmur, click, rub or gallop  Abdomen: soft, non-tender; bowel sounds normal; no masses,  no organomegaly   Vulva:  not evaluated  Vagina: not evaluated  Cervix:  not evaluated  Corpus: not examined  Adnexa:  not evaluated  Rectal Exam: Not performed.        Assessment:     Normal postpartum exam. Pap smear not done at today's visit. Last pap 03/2017, negative.  Plan:    1. Contraception: Depo-Provera injections 2. Return for Depo injection every 12 weeks, plans to go to health department for further injections.   3. Follow up in: 1 year for annual exam or as needed.    Rolm BookbinderCaroline M Neill, CNM 12/17/17 10:31 AM

## 2019-06-17 ENCOUNTER — Other Ambulatory Visit: Payer: Self-pay

## 2019-06-17 ENCOUNTER — Emergency Department (HOSPITAL_COMMUNITY): Payer: Self-pay

## 2019-06-17 ENCOUNTER — Emergency Department (HOSPITAL_COMMUNITY)
Admission: EM | Admit: 2019-06-17 | Discharge: 2019-06-17 | Disposition: A | Discharge status: Home / Self Care | Payer: Self-pay | Attending: Emergency Medicine | Admitting: Emergency Medicine

## 2019-06-17 ENCOUNTER — Encounter (HOSPITAL_COMMUNITY): Payer: Self-pay | Admitting: *Deleted

## 2019-06-17 DIAGNOSIS — R0789 Other chest pain: Secondary | ICD-10-CM

## 2019-06-17 DIAGNOSIS — R072 Precordial pain: Secondary | ICD-10-CM | POA: Insufficient documentation

## 2019-06-17 DIAGNOSIS — Z6379 Other stressful life events affecting family and household: Secondary | ICD-10-CM | POA: Insufficient documentation

## 2019-06-17 DIAGNOSIS — R002 Palpitations: Secondary | ICD-10-CM | POA: Insufficient documentation

## 2019-06-17 LAB — TROPONIN I (HIGH SENSITIVITY)
Troponin I (High Sensitivity): 4 ng/L (ref <18)
Troponin I (High Sensitivity): 3 ng/L (ref <18)

## 2019-06-17 LAB — CBC
HCT: 43.3 % (ref 36.0–46.0)
Hemoglobin: 14.8 g/dL (ref 12.0–15.0)
MCH: 31.4 pg (ref 26.0–34.0)
MCHC: 34.2 g/dL (ref 30.0–36.0)
MCV: 91.7 fL (ref 80.0–100.0)
Platelets: 355 10*3/uL (ref 150–400)
RBC: 4.72 MIL/uL (ref 3.87–5.11)
RDW: 12.7 % (ref 11.5–15.5)
WBC: 5.2 10*3/uL (ref 4.0–10.5)
nRBC: 0 % (ref 0.0–0.2)

## 2019-06-17 LAB — BASIC METABOLIC PANEL
Anion gap: 10 (ref 5–15)
BUN: 7 mg/dL (ref 6–20)
CO2: 24 mmol/L (ref 22–32)
Calcium: 9.4 mg/dL (ref 8.9–10.3)
Chloride: 105 mmol/L (ref 98–111)
Creatinine, Ser: 0.72 mg/dL (ref 0.44–1.00)
GFR calc Af Amer: >60 mL/min (ref >60)
GFR calc non Af Amer: >60 mL/min (ref >60)
Glucose, Bld: 108 mg/dL — ABNORMAL HIGH (ref 70–99)
Potassium: 3.8 mmol/L (ref 3.5–5.1)
Sodium: 139 mmol/L (ref 135–145)

## 2019-06-17 LAB — I-STAT BETA HCG BLOOD, ED (MC, WL, AP ONLY): I-stat hCG, quantitative: <5 m[IU]/mL (ref <5)

## 2019-06-17 MED ORDER — SODIUM CHLORIDE 0.9% FLUSH: 3.0000 mL | Freq: Once | INTRAVENOUS | Status: DC

## 2019-06-17 NOTE — Discharge Instructions (Addendum)
Refrain from excessive caffeine consumption.  Follow-up with cardiology if your symptoms persist.  The contact information for the [facility] health cardiology group has been provided in this discharge summary for you to call and make these arrangements.  Return to the emergency department in the meantime if your symptoms significantly worsen or change.

## 2019-06-17 NOTE — ED Provider Notes (Signed)
[facility]   CSN: [id number] Arrival date & time: 06/17/19  [id number]     History   Chief Complaint Chief Complaint  Patient presents with  . Chest Pain    HPI [name] is a 33 y.o. female.     Patient is a 33 year old female with no significant past medical history.  She presents today for evaluation of chest discomfort and palpitations.  Patient reports an episode yesterday while she was running errands where she developed tightness in her chest followed by rapid heartbeat.  She describes palpitations that lasted several minutes, then resolved.  She has had episodes similar to this in the past, but is never seen a physician.  Patient is currently symptom-free and has no complaints.  Patient does describe excessive stress including caring for her 4 children and keeping up with the needs of her family.  She also tells me that her father passed away 2 years ago and this has been upsetting to her.  She also tells me that she drinks significant quantities of caffeine in the forms of coffee and soda.  She tells me she drank a coffee from [employer] just prior to the onset of symptoms yesterday.  Patient has no prior cardiac history and has no risk factors.  The history is provided by the patient.  Chest Pain Pain location:  Substernal area Pain quality: tightness   Pain radiates to:  Does not radiate Pain severity:  Moderate Onset quality:  Sudden Duration:  2 minutes Timing:  Constant Progression:  Resolved Chronicity:  Recurrent Relieved by:  Nothing Worsened by:  Nothing   Past Medical History:  Diagnosis Date  . H/O eclampsia   . History of severe pre-eclampsia 2005/2008   1st and 2nd pregnancy    Patient Active Problem List   Diagnosis Date Noted  . Normal labor 10/19/2017  . History of eclampsia 05/08/2017  . History of low birth weight 05/08/2017  . Supervision of high risk pregnancy, antepartum,  second trimester 05/08/2017  . History of preterm delivery 05/08/2017  . Language barrier 05/08/2017  . 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 NO PAST SURGERIES       OB History    Gravida  4   Para  4   Term  2   Preterm  2   AB  0   Living  4     SAB  0   TAB  0   Ectopic  0   Multiple  0   Live Births  4        Obstetric Comments  Iatrogenic PTB x 2 (eclampsia and severe pre-eclampsia)         Home Medications    Prior to Admission medications   Medication Sig Start Date End Date Taking? Authorizing Provider  prenatal vitamin w/FE, FA (PRENATAL 1 + 1) 27-1 MG TABS Take 1 tablet by mouth daily.      [provider]    Family History Family History  Problem Relation Age of Onset  . Diabetes Mother   . Hypertension Father     Social History Social History   Tobacco Use  . Smoking status: Never Smoker  . Smokeless tobacco: Never Used  Substance Use Topics  . Alcohol use: No  . Drug use: No     Allergies   Patient has no known allergies.  Review of Systems Review of Systems  Cardiovascular: Positive for chest pain.  All other systems reviewed and are negative.    Physical Exam Updated Vital Signs BP 119/77 (BP Location: Right Arm)   Pulse 69   Temp 98.9 F (37.2 C) (Oral)   Resp 16   Ht 5\' 3"  (1.6 m)   Wt 79.8 kg   LMP 05/27/2019   SpO2 99%   Breastfeeding No   BMI 31.18 kg/m   Physical Exam Vitals signs and nursing note reviewed.  Constitutional:      General: She is not in acute distress.    Appearance: She is well-developed. She is not diaphoretic.  HENT:     Head: Normocephalic and atraumatic.  Neck:     Musculoskeletal: Normal range of motion and neck supple.  Cardiovascular:     Rate and Rhythm: Normal rate and regular rhythm.     Heart sounds: No murmur. No friction rub. No gallop.   Pulmonary:      Effort: Pulmonary effort is normal. No respiratory distress.     Breath sounds: Normal breath sounds. No wheezing.  Abdominal:     General: Bowel sounds are normal. There is no distension.     Palpations: Abdomen is soft.     Tenderness: There is no abdominal tenderness.  Musculoskeletal: Normal range of motion.  Skin:    General: Skin is warm and dry.  Neurological:     Mental Status: She is alert and oriented to person, place, and time.      ED Treatments / Results  Labs (all labs ordered are listed, but only abnormal results are displayed) Labs Reviewed  BASIC METABOLIC PANEL - Abnormal; Notable for the following components:      Result Value   Glucose, Bld 108 (*)    All other components within normal limits  CBC  I-STAT BETA HCG BLOOD, ED ([facility], [facility], [facility] ONLY)  TROPONIN I (HIGH SENSITIVITY)  TROPONIN I (HIGH SENSITIVITY)    EKG EKG Interpretation  Date/Time:  Thursday June 17 2019 08:55:38 EDT Ventricular Rate:  74 PR Interval:  136 QRS Duration: 88 QT Interval:  382 QTC Calculation: 424 R Axis:   5 Text Interpretation:  Normal sinus rhythm Nonspecific T wave abnormality Abnormal ECG Confirmed by Geoffery LyonseLo, Bomani Oommen ([id number]) on 06/17/2019 3:44:31 PM   Radiology Dg Chest 2 View  Result Date: 06/17/2019 CLINICAL DATA:  Chest pain, heart racing today since 0400 hours, chest pressure now EXAM: CHEST - 2 VIEW COMPARISON:  None FINDINGS: Normal heart size, mediastinal contours, and pulmonary vascularity. Lungs clear. No pleural effusion or pneumothorax. Bones unremarkable. IMPRESSION: Normal exam. Electronically Signed   By: Ulyses SouthwardMark  Boles M.D.   On: 06/17/2019 09:40    Procedures Procedures (including critical care time)  Medications Ordered in ED Medications  sodium chloride flush (NS) 0.9 % injection 3 mL (has no administration in time range)     Initial Impression / Assessment and Plan / ED Course  I have reviewed the triage vital signs and the nursing notes.   Pertinent labs & imaging results that were available during my care of the patient were reviewed by me and considered in my medical decision making (see chart for details).  Patient presenting here with complaints of chest discomfort and palpitations as described in the HPI.  The patient has no cardiac risk factors, is symptom-free, has had negative high-sensitivity troponin x2, and vital signs are stable.  The remainder of her work-up is unremarkable and I  see no signs of ischemia on her EKG.  She is also been in a sinus rhythm with no ectopy.  At this point, I feel as though patient is appropriate for discharge with outpatient follow-up.  I have advised her to refrain from excessive caffeine consumption.  If her symptoms persist, she should follow-up with cardiology and will be given the contact information for the cardiology clinic.  She understands to return to the ER if her symptoms worsen or change.  Final Clinical Impressions(s) / ED Diagnoses   Final diagnoses:  None    ED Discharge Orders    None       Veryl Speak, MD 06/17/19 1626

## 2019-06-17 NOTE — ED Triage Notes (Signed)
Pt in c/o heart racing onset today at 4am, pt denies SOB, denies v/d, pt c/o SOB, pt reports fast HR lasting for about 10 mins and has subsided, pt c/o cp pressure now, A&O x4

## 2019-07-01 ENCOUNTER — Encounter (INDEPENDENT_AMBULATORY_CARE_PROVIDER_SITE_OTHER): Payer: Self-pay | Admitting: Primary Care

## 2019-07-01 ENCOUNTER — Other Ambulatory Visit: Payer: Self-pay

## 2019-07-01 ENCOUNTER — Ambulatory Visit (INDEPENDENT_AMBULATORY_CARE_PROVIDER_SITE_OTHER): Payer: Self-pay | Admitting: Primary Care

## 2019-07-01 VITALS — BP 118/79 | HR 73 | Temp 98.9°F | Ht 63.0 in | Wt 170.6 lb

## 2019-07-01 DIAGNOSIS — Z09 Encounter for follow-up examination after completed treatment for conditions other than malignant neoplasm: Secondary | ICD-10-CM

## 2019-07-01 DIAGNOSIS — K5900 Constipation, unspecified: Secondary | ICD-10-CM

## 2019-07-01 DIAGNOSIS — K219 Gastro-esophageal reflux disease without esophagitis: Secondary | ICD-10-CM

## 2019-07-01 DIAGNOSIS — Z7689 Persons encountering health services in other specified circumstances: Secondary | ICD-10-CM

## 2019-07-01 DIAGNOSIS — N912 Amenorrhea, unspecified: Secondary | ICD-10-CM

## 2019-07-01 LAB — POCT URINE PREGNANCY: Preg Test, Ur: NEGATIVE

## 2019-07-01 MED ORDER — SENNA 8.6 MG PO TABS: 1.0000 | ORAL_TABLET | Freq: Every day | ORAL | 0 refills | Status: DC | PRN

## 2019-07-01 MED ORDER — OMEPRAZOLE 20 MG PO CPDR: 20.0000 mg | DELAYED_RELEASE_CAPSULE | Freq: Every day | ORAL | 3 refills | Status: DC

## 2019-07-01 NOTE — Progress Notes (Signed)
Chest pain two weeks ago

## 2019-07-01 NOTE — Progress Notes (Signed)
New Patient Office Visit  Subjective:  Patient ID: [name], female    DOB: 01/25/1986  Age: 33 y.o. MRN: [id number] Chelsea KaufmanMarvin #[id number] CC:  Chief Complaint  Patient presents with  . New Patient (Initial Visit)    anxiety and chest pain    HPI Chelsea AbideHilda Guzman presents for a establishment of care and emergency room follow up. She presented to the ED on 06/17/2019 for chest discomfort and palpitations.  She notices this has been happening frequently and wanted to make sure she was not having a heart problems  Past Medical History:  Diagnosis Date  . H/O eclampsia   . History of severe pre-eclampsia 2005/2008   1st and 2nd pregnancy    Past Surgical History:  Procedure Laterality Date  . NO PAST SURGERIES      Family History  Problem Relation Age of Onset  . Diabetes Mother   . Hypertension Father     Social History   Socioeconomic History  . Marital status: Single    Spouse name: Not on file  . Number of children: Not on file  . Years of education: Not on file  . Highest education level: Not on file  Occupational History  . Not on file  Social Needs  . Financial resource strain: Not on file  . Food insecurity    Worry: Not on file    Inability: Not on file  . Transportation needs    Medical: Not on file    Non-medical: Not on file  Tobacco Use  . Smoking status: Never Smoker  . Smokeless tobacco: Never Used  Substance and Sexual Activity  . Alcohol use: No  . Drug use: No  . Sexual activity: Yes    Birth control/protection: None  Lifestyle  . Physical activity    Days per week: Not on file    Minutes per session: Not on file  . Stress: Not on file  Relationships  . Social Musicianconnections    Talks on phone: Not on file    Gets together: Not on file    Attends religious service: Not on file    Active member of club or organization: Not on file    Attends meetings of clubs or organizations: Not on file    Relationship status: Not on  file  . Intimate partner violence    Fear of current or ex partner: Not on file    Emotionally abused: Not on file    Physically abused: Not on file    Forced sexual activity: Not on file  Other Topics Concern  . Not on file  Social History Narrative  . Not on file    ROS Review of Systems  Cardiovascular: Positive for palpitations.  Gastrointestinal: Positive for constipation.  All other systems reviewed and are negative.   Objective:   Today's Vitals: BP 118/79 (BP Location: Right Arm, Patient Position: Sitting, Cuff Size: Normal)   Pulse 73   Temp 98.9 F (37.2 C) (Tympanic)   Ht 5\' 3"  (1.6 m)   Wt 170 lb 9.6 oz (77.4 kg)   LMP 05/19/2019 (Approximate)   SpO2 100%   Breastfeeding No   BMI 30.22 kg/m   Physical Exam Constitutional:      Appearance: Normal appearance.  HENT:     Head: Normocephalic.     Nose: Nose normal.  Neck:     Musculoskeletal: Normal range of motion.  Cardiovascular:     Rate and Rhythm: Normal rate and regular rhythm.  Pulmonary:     Effort: Pulmonary effort is normal.     Breath sounds: Normal breath sounds.  Musculoskeletal: Normal range of motion.  Skin:    General: Skin is warm.  Neurological:     General: No focal deficit present.     Mental Status: She is alert.     Assessment & Plan:   Problem List Items Addressed This Visit    None    Visit Diagnoses    Encounter to establish care    [facility] discharge follow-up       Constipation, unspecified constipation type       Gastroesophageal reflux disease, esophagitis presence not specified       Relevant Medications   senna (SENOKOT) 8.6 MG TABS tablet   omeprazole (PRILOSEC) 20 MG capsule   Amenorrhea       Relevant Orders   POCT urine pregnancy (Completed)      Outpatient Encounter Medications as of 07/01/2019  Medication Sig  . omeprazole (PRILOSEC) 20 MG capsule Take 1 capsule (20 mg total) by mouth daily.  Marland Kitchen senna (SENOKOT) 8.6 MG TABS tablet Take  1 tablet (8.6 mg total) by mouth daily as needed for mild constipation.  . [DISCONTINUED] prenatal vitamin w/FE, FA (PRENATAL 1 + 1) 27-1 MG TABS Take 1 tablet by mouth daily.     No facility-administered encounter medications on file as of 07/01/2019.   [name] was seen today for new patient (initial visit).  Diagnoses and all orders for this visit:  Encounter to establish care Establishing care with PCP to manage health maintenance  Hospital discharge follow-up Presented to the ED for chest discomfort and palpitation. No t cardiac related most likely due to large amounts of ingested caffine  Constipation, unspecified constipation type MANAGEMENT OF CHRONIC CONSTIPATION   Drink fluids in the recommended amount everyday. Recommend amount is 8 cups of water daily. Do not replace water with Gatorade or Powerade as these should only be used when you are dehydrated.   Eat lots of high fiber foods-fruits, veggies, bran and whole grain instead of white bread  Be active everyday. Inactivity makes constipation worse.  Add psyllium daily (Metamucil) which comes in capsules now. Start very low dose and work up to recommended dose on bottle daily.  Stay away from [place] or any magnesium containing laxative, unless you need it to clear things out rarely. It is an addictive laxative and your gut will become dependent on it.  If that is not working, I would start Miralax, which you can buy in generic 17 gms daily. It's a powder and not an "addictive laxative". Take it every day and titrate the dose up or down to get the daily Bm.  We will consider the use of other pharmacological treatments should the above recommendations prove to be unsuccessful.   Gastroesophageal reflux disease, esophagitis presence not specified Discussed eating small frequent meal, reduction in acidic foods, fried foods ,spicy foods, alcohol caffeine and tobacco and certain medications. Avoid laying down after eating  43mins-1hour, elevated head of the bed.  Amenorrhea Is the absent of a menstrual cycle for 3 months. She is not on birthcontrol will rule out pregnancy.  -     POCT urine pregnancy  Other orders -     senna (SENOKOT) 8.6 MG TABS tablet; Take 1 tablet (8.6 mg total) by mouth daily as needed for mild constipation. -     omeprazole (PRILOSEC) 20 MG capsule; Take  1 capsule (20 mg total) by mouth daily.    Follow-up: Return in about 3 months (around 10/01/2019) for GERD, constipation.   Grayce SessionsMichelle P Jennifermarie Franzen, NP

## 2019-07-16 ENCOUNTER — Other Ambulatory Visit: Payer: Self-pay

## 2019-07-16 ENCOUNTER — Ambulatory Visit: Payer: Self-pay | Attending: Family Medicine

## 2019-09-30 ENCOUNTER — Ambulatory Visit (INDEPENDENT_AMBULATORY_CARE_PROVIDER_SITE_OTHER): Payer: Self-pay | Admitting: Primary Care

## 2019-11-11 ENCOUNTER — Ambulatory Visit: Payer: HRSA Program | Attending: Internal Medicine

## 2019-11-11 DIAGNOSIS — Z20828 Contact with and (suspected) exposure to other viral communicable diseases: Secondary | ICD-10-CM | POA: Diagnosis present

## 2019-11-11 DIAGNOSIS — Z20822 Contact with and (suspected) exposure to covid-19: Secondary | ICD-10-CM

## 2019-11-12 LAB — NOVEL CORONAVIRUS, NAA: SARS-CoV-2, NAA: NOT DETECTED

## 2019-11-13 ENCOUNTER — Telehealth (INDEPENDENT_AMBULATORY_CARE_PROVIDER_SITE_OTHER): Payer: Self-pay | Admitting: Primary Care

## 2019-11-13 NOTE — Telephone Encounter (Signed)
Patient is calling to receive her negative COVID test results. Patient expressed understanding. 

## 2020-09-15 IMAGING — DX CHEST - 2 VIEW
2 series · 2 of 2 positions shown · non-contrast
Comparison: None

CLINICAL DATA: Chest pain, heart racing today since 4344 hours,
chest pressure now

EXAM:
CHEST - 2 VIEW

[chest pa]
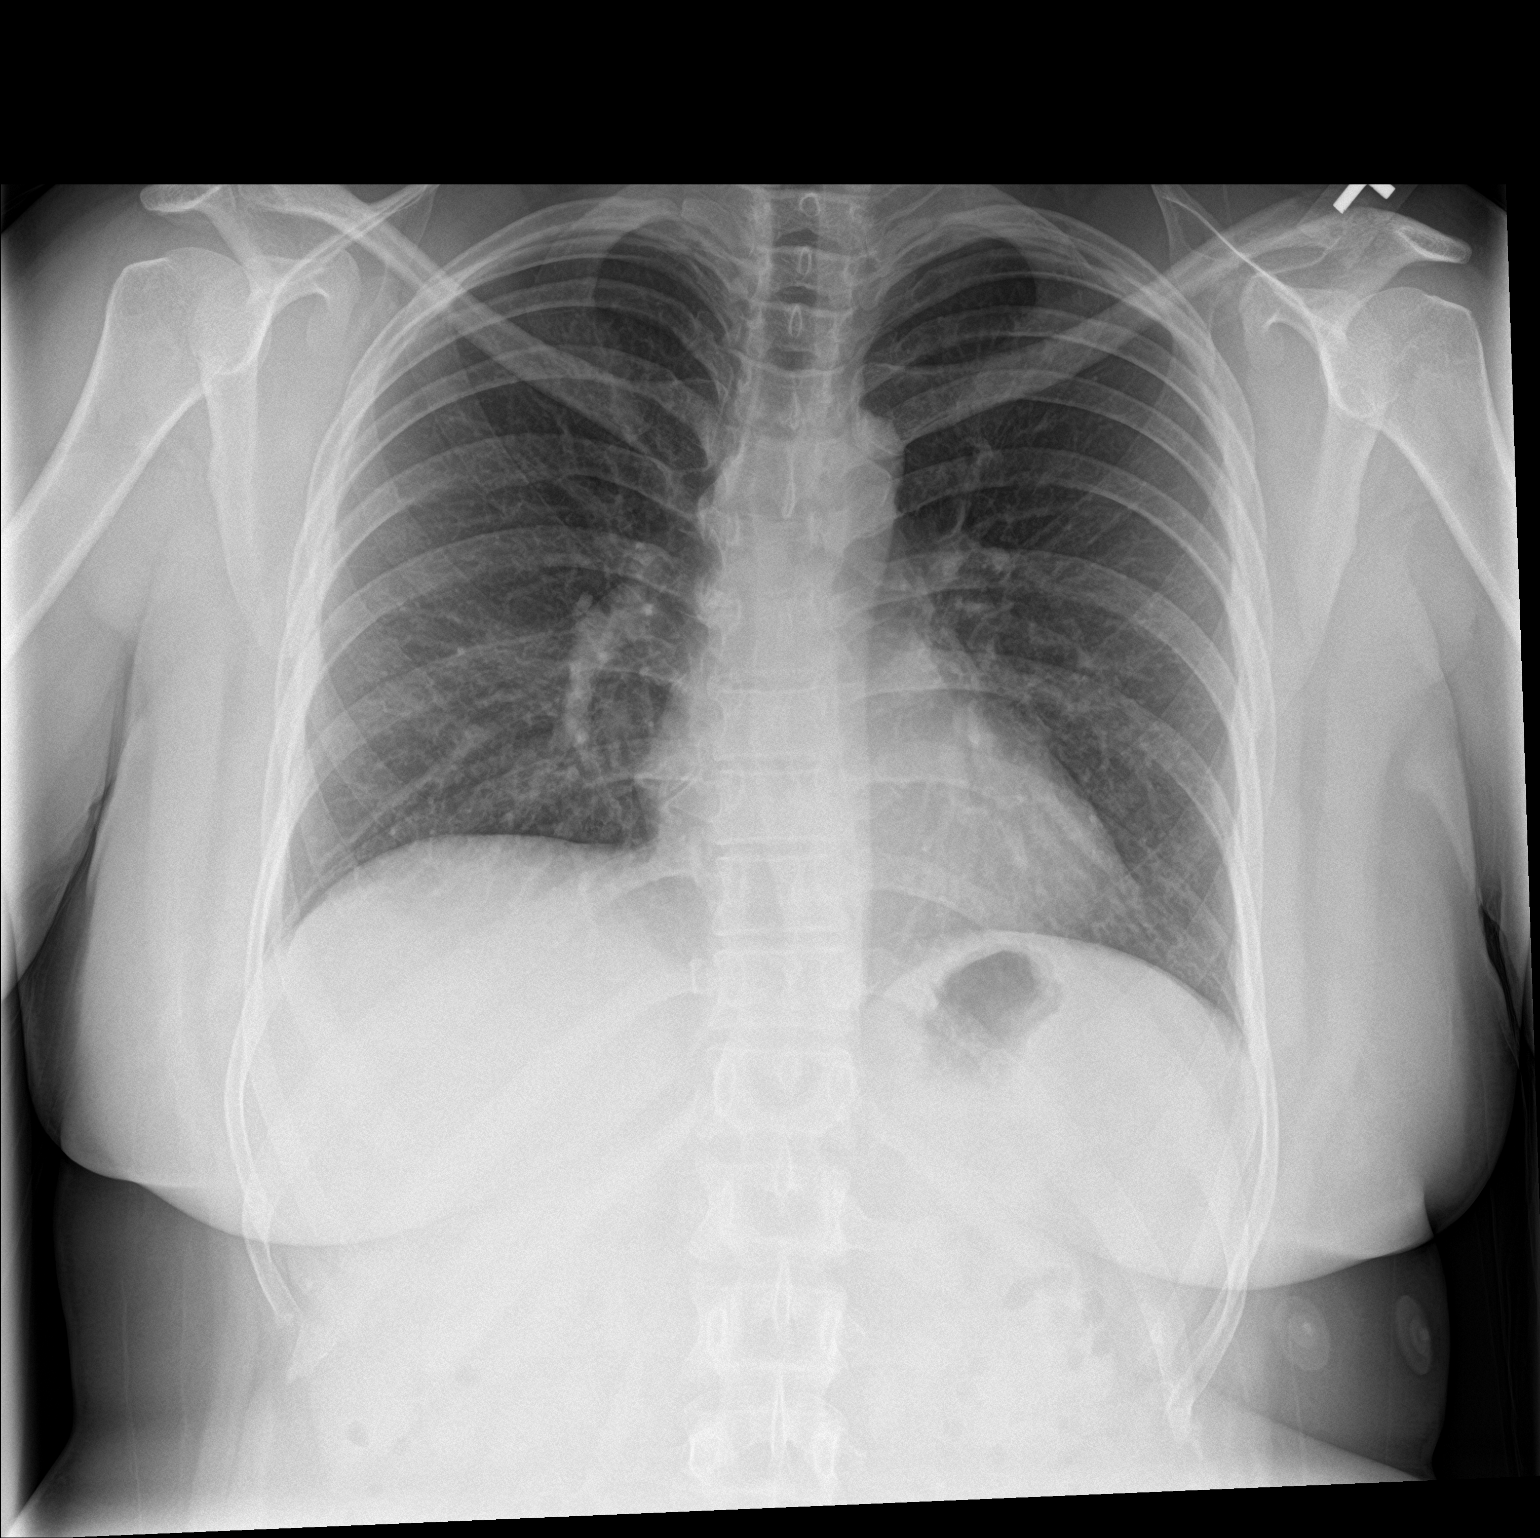

[chest lat]
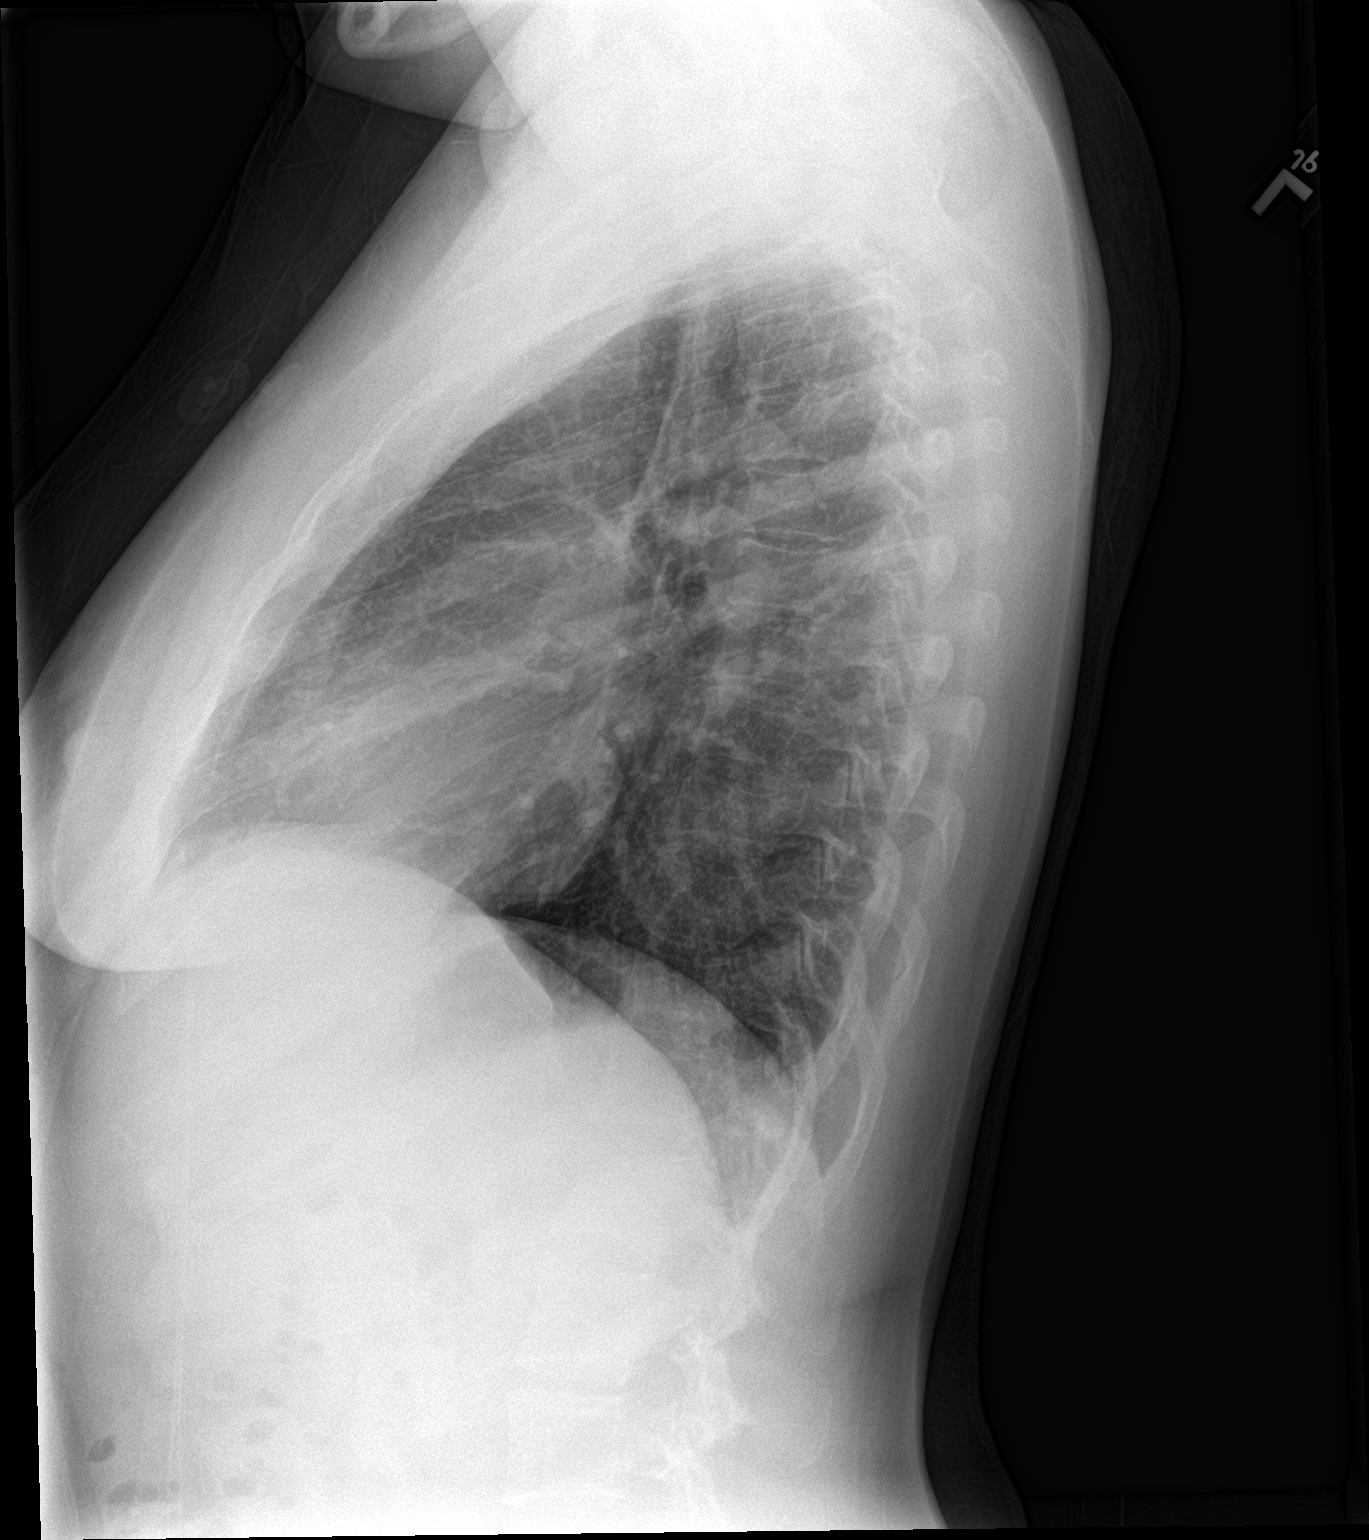

[2 of 2 positions shown; findings below may reference images not displayed]

FINDINGS: Normal heart size, mediastinal contours, and pulmonary vascularity.

Lungs clear.

No pleural effusion or pneumothorax.

Bones unremarkable.
IMPRESSION: Normal exam.

## 2023-05-18 ENCOUNTER — Encounter (HOSPITAL_COMMUNITY): Payer: Self-pay

## 2023-05-18 ENCOUNTER — Ambulatory Visit (HOSPITAL_COMMUNITY)
Admission: EM | Admit: 2023-05-18 | Discharge: 2023-05-18 | Disposition: A | Discharge status: Home / Self Care | Payer: Self-pay | Attending: Family Medicine | Admitting: Family Medicine

## 2023-05-18 ENCOUNTER — Ambulatory Visit (INDEPENDENT_AMBULATORY_CARE_PROVIDER_SITE_OTHER): Payer: Self-pay

## 2023-05-18 DIAGNOSIS — M25562 Pain in left knee: Secondary | ICD-10-CM

## 2023-05-18 MED ORDER — NAPROXEN 500 MG PO TABS: 500.0000 mg | ORAL_TABLET | Freq: Two times a day (BID) | ORAL | 0 refills | Status: AC | PRN

## 2023-05-18 NOTE — Discharge Instructions (Addendum)
X-ray did not show any bony problems(No hay problemas de los huesos en las radiografias)  Take naproxen 500 mg--1 tablet every 12 hours as needed for pain  (Tome 1 tableta por la boca cada 12 horas si tiene dolor)   You can use the QR code/website at the back of the summary paperwork to schedule yourself a new patient appointment with primary care.  (puede hacer una cita con medicine general si [place] el sitio del [employer] fast streamer)

## 2023-05-18 NOTE — ED Provider Notes (Signed)
[facility]    CSN: [id number] Arrival date & time: 05/18/23  1015      History   Chief Complaint Chief Complaint  Patient presents with   Knee Injury    HPI [name] is a 37 y.o. female.   HPI Here for left knee pain  About 1 month ago she had just started to jog and noted some pain in her posterior knee.  It then swelled up and hurt anteriorly.  Then a couple weeks later it swelled up again after maybe she twisted it.  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 Authorizing Provider  naproxen (NAPROSYN) 500 MG tablet Take 1 tablet (500 mg total) by mouth 2 (two) times daily as needed (pain). 05/18/23  Yes Zenia Resides, MD    Family History Family History  Problem Relation Age of Onset   Diabetes Mother    Hypertension Father     Social History Social History   Tobacco Use   Smoking status: Never    Smokeless tobacco: Never  Vaping Use   Vaping Use: Never used  Substance Use Topics   Alcohol use: No   Drug use: No     Allergies   Patient has no known allergies.   Review of Systems Review of Systems   Physical Exam Triage Vital Signs ED Triage Vitals  Enc Vitals Group     BP 05/18/23 1111 (!) 127/91     Pulse Rate 05/18/23 1111 73     Resp 05/18/23 1111 14     Temp 05/18/23 1111 98 F (36.7 C)     Temp Source 05/18/23 1111 Oral     SpO2 05/18/23 1111 98 %     Weight --      Height --      Head Circumference --      Peak Flow --      Pain Score 05/18/23 1116 4     Pain Loc --      Pain Edu? --      Excl. in GC? --    No data found.  Updated Vital Signs  BP (!) 127/91 (BP Location: Right Arm)   Pulse 73   Temp 98 F (36.7 C) (Oral)   Resp 14   LMP 05/08/2023   SpO2 98%   Visual Acuity Right Eye Distance:   Left Eye Distance:   Bilateral Distance:    Right Eye Near:   Left Eye Near:    Bilateral Near:     Physical Exam Vitals reviewed.  Constitutional:      General: She is not in acute distress.    Appearance: She is not ill-appearing, toxic-appearing or diaphoretic.  Musculoskeletal:     Comments: There is tenderness of the anterior left knee and some effusion is evident.  Skin:    Coloration: Skin is not pale.  Neurological:     General: No focal deficit present.     Mental Status: She is alert and oriented to person, place, and time.  Psychiatric:        Behavior: Behavior normal.      UC Treatments / Results  Labs (all labs ordered are listed, but only abnormal results are displayed) Labs Reviewed - No data to display  EKG   Radiology DG Knee Complete 4 Views Left  Result Date: 05/18/2023 CLINICAL DATA:  Left knee injury and pain. Patient states she was running prior to volleyball practice months ago and felt something pull in her left knee. Two days later she was hit with a volleyball. Patient notes swelling and pain have not  decreased since the incident. EXAM: LEFT KNEE - COMPLETE 4+ VIEW COMPARISON:  None Available. FINDINGS: No evidence of fracture, dislocation, or joint effusion. No evidence of arthropathy or other focal bone abnormality. Soft tissues are unremarkable. IMPRESSION: Negative. Electronically Signed   By: Sherron Ales M.D.   On: 05/18/2023 11:54    Procedures Procedures (including critical care time)  Medications Ordered in UC Medications - No data to display  Initial Impression / Assessment and Plan / UC Course  I have reviewed the triage vital signs and the nursing notes.  Pertinent labs & imaging results that were available during my care of the patient were reviewed by me and considered in my medical decision making (see chart for details).        X-ray of the milligrams   Knee sleeve brace is applied.  She is given instructions on how to set up a new patient appointment with primary care and is given contact information for orthopedics. Final Clinical Impressions(s) / UC Diagnoses   Final diagnoses:  Acute pain of left knee     Discharge Instructions      X-ray did not show any bony problems(No hay problemas de los huesos en las radiografias)  Take naproxen 500 mg--1 tablet every 12 hours as needed for pain  (Tome 1 tableta por la boca cada 12 horas si tiene dolor)   You can use the QR code/website at the back of the summary paperwork to schedule yourself a new patient appointment with primary care.  (puede hacer una cita con medicine general si [place] el sitio del [employer] fast streamer)      ED Prescriptions     Medication Sig Dispense Auth. Provider   naproxen (NAPROSYN) 500 MG tablet Take 1 tablet (500 mg total) by mouth 2 (two) times daily as needed (pain). 30 tablet Maraya Gwilliam, Janace Aris, MD      PDMP not reviewed this encounter.   Zenia Resides, MD 05/18/23 [phone]

## 2023-05-18 NOTE — ED Triage Notes (Signed)
Per Interpreter Melanee Spry [phone]  Patient reports that she was running prior to volleyball practice months ago and felt something pull in her left knee and then 2 days later she was hit with a volleyball. Patient states the swelling and pain have not decreased since the incident.  Patient states she has been taking Ibuprofen, but states she does not take it frequently and does not remember when the last dose was.
